# Patient Record
Sex: Male | Born: 1945
Health system: Southern US, Community
[De-identification: ages and names within clinical notes are randomized; demographics above are authoritative.]

## PROBLEM LIST (undated history)

## (undated) DIAGNOSIS — E785 Hyperlipidemia, unspecified: Secondary | ICD-10-CM

## (undated) DIAGNOSIS — I2510 Atherosclerotic heart disease of native coronary artery without angina pectoris: Secondary | ICD-10-CM

## (undated) DIAGNOSIS — I4891 Unspecified atrial fibrillation: Secondary | ICD-10-CM

## (undated) DIAGNOSIS — I10 Essential (primary) hypertension: Secondary | ICD-10-CM

## (undated) DIAGNOSIS — R002 Palpitations: Secondary | ICD-10-CM

## (undated) HISTORY — DX: Palpitations: R00.2

## (undated) HISTORY — DX: Atherosclerotic heart disease of native coronary artery without angina pectoris: I25.10

## (undated) HISTORY — DX: Essential (primary) hypertension: I10

## (undated) HISTORY — DX: Unspecified atrial fibrillation: I48.91

## (undated) HISTORY — DX: Hyperlipidemia, unspecified: E78.5

---

## 1997-07-30 ENCOUNTER — Other Ambulatory Visit: Admission: RE | Admit: 1997-07-30 | Discharge: 1997-07-30 | Payer: Self-pay | Admitting: Family Medicine

## 2000-01-17 HISTORY — PX: APPENDECTOMY: SHX54

## 2000-12-14 ENCOUNTER — Encounter: Admission: RE | Admit: 2000-12-14 | Discharge: 2000-12-14 | Payer: Self-pay | Admitting: Family Medicine

## 2000-12-14 ENCOUNTER — Encounter: Payer: Self-pay | Admitting: Family Medicine

## 2000-12-20 ENCOUNTER — Encounter: Payer: Self-pay | Admitting: Surgery

## 2000-12-20 ENCOUNTER — Encounter: Payer: Self-pay | Admitting: Family Medicine

## 2000-12-20 ENCOUNTER — Encounter: Admission: RE | Admit: 2000-12-20 | Discharge: 2000-12-20 | Payer: Self-pay | Admitting: Family Medicine

## 2000-12-20 ENCOUNTER — Emergency Department (HOSPITAL_COMMUNITY): Admission: EM | Admit: 2000-12-20 | Discharge: 2000-12-20 | Payer: Self-pay | Admitting: Emergency Medicine

## 2001-01-31 ENCOUNTER — Inpatient Hospital Stay (HOSPITAL_COMMUNITY): Admission: RE | Admit: 2001-01-31 | Discharge: 2001-02-01 | Payer: Self-pay | Admitting: Surgery

## 2001-06-25 ENCOUNTER — Ambulatory Visit (HOSPITAL_COMMUNITY): Admission: RE | Admit: 2001-06-25 | Discharge: 2001-06-25 | Payer: Self-pay | Admitting: Gastroenterology

## 2002-03-17 ENCOUNTER — Encounter: Payer: Self-pay | Admitting: Family Medicine

## 2002-03-17 ENCOUNTER — Encounter: Admission: RE | Admit: 2002-03-17 | Discharge: 2002-03-17 | Payer: Self-pay | Admitting: Family Medicine

## 2002-11-17 HISTORY — PX: KNEE CARTILAGE SURGERY: SHX688

## 2003-04-19 DIAGNOSIS — I2510 Atherosclerotic heart disease of native coronary artery without angina pectoris: Secondary | ICD-10-CM

## 2003-04-19 HISTORY — DX: Atherosclerotic heart disease of native coronary artery without angina pectoris: I25.10

## 2003-08-18 ENCOUNTER — Ambulatory Visit (HOSPITAL_COMMUNITY): Admission: RE | Admit: 2003-08-18 | Discharge: 2003-08-18 | Payer: Self-pay | Admitting: *Deleted

## 2003-09-08 ENCOUNTER — Ambulatory Visit (HOSPITAL_COMMUNITY): Admission: RE | Admit: 2003-09-08 | Discharge: 2003-09-09 | Payer: Self-pay | Admitting: *Deleted

## 2004-03-10 ENCOUNTER — Ambulatory Visit (HOSPITAL_COMMUNITY): Admission: RE | Admit: 2004-03-10 | Discharge: 2004-03-10 | Payer: Self-pay | Admitting: *Deleted

## 2004-03-18 HISTORY — PX: CARDIAC CATHETERIZATION: SHX172

## 2004-07-17 HISTORY — PX: COLONOSCOPY: SHX174

## 2004-08-05 ENCOUNTER — Ambulatory Visit (HOSPITAL_COMMUNITY): Admission: RE | Admit: 2004-08-05 | Discharge: 2004-08-05 | Payer: Self-pay | Admitting: Gastroenterology

## 2005-05-27 ENCOUNTER — Ambulatory Visit (HOSPITAL_BASED_OUTPATIENT_CLINIC_OR_DEPARTMENT_OTHER): Admission: RE | Admit: 2005-05-27 | Discharge: 2005-05-27 | Payer: Self-pay | Admitting: Surgery

## 2007-06-19 DIAGNOSIS — I4891 Unspecified atrial fibrillation: Secondary | ICD-10-CM

## 2007-06-19 HISTORY — DX: Unspecified atrial fibrillation: I48.91

## 2011-04-20 DIAGNOSIS — I4891 Unspecified atrial fibrillation: Secondary | ICD-10-CM | POA: Diagnosis not present

## 2011-05-23 DIAGNOSIS — I4891 Unspecified atrial fibrillation: Secondary | ICD-10-CM | POA: Diagnosis not present

## 2011-06-20 DIAGNOSIS — I4891 Unspecified atrial fibrillation: Secondary | ICD-10-CM | POA: Diagnosis not present

## 2011-07-21 DIAGNOSIS — I4891 Unspecified atrial fibrillation: Secondary | ICD-10-CM | POA: Diagnosis not present

## 2011-08-18 DIAGNOSIS — I4891 Unspecified atrial fibrillation: Secondary | ICD-10-CM | POA: Diagnosis not present

## 2011-09-15 DIAGNOSIS — I2510 Atherosclerotic heart disease of native coronary artery without angina pectoris: Secondary | ICD-10-CM | POA: Diagnosis not present

## 2011-09-15 DIAGNOSIS — E782 Mixed hyperlipidemia: Secondary | ICD-10-CM | POA: Diagnosis not present

## 2011-09-15 DIAGNOSIS — I4891 Unspecified atrial fibrillation: Secondary | ICD-10-CM | POA: Diagnosis not present

## 2011-10-17 DIAGNOSIS — I4891 Unspecified atrial fibrillation: Secondary | ICD-10-CM | POA: Diagnosis not present

## 2011-10-17 DIAGNOSIS — Z7901 Long term (current) use of anticoagulants: Secondary | ICD-10-CM | POA: Diagnosis not present

## 2011-11-07 DIAGNOSIS — D239 Other benign neoplasm of skin, unspecified: Secondary | ICD-10-CM | POA: Diagnosis not present

## 2011-11-07 DIAGNOSIS — Z85828 Personal history of other malignant neoplasm of skin: Secondary | ICD-10-CM | POA: Diagnosis not present

## 2011-11-07 DIAGNOSIS — L821 Other seborrheic keratosis: Secondary | ICD-10-CM | POA: Diagnosis not present

## 2011-11-07 DIAGNOSIS — L570 Actinic keratosis: Secondary | ICD-10-CM | POA: Diagnosis not present

## 2011-11-14 DIAGNOSIS — I4891 Unspecified atrial fibrillation: Secondary | ICD-10-CM | POA: Diagnosis not present

## 2011-12-12 DIAGNOSIS — I4891 Unspecified atrial fibrillation: Secondary | ICD-10-CM | POA: Diagnosis not present

## 2012-01-17 DIAGNOSIS — I4891 Unspecified atrial fibrillation: Secondary | ICD-10-CM | POA: Diagnosis not present

## 2012-02-20 DIAGNOSIS — M79609 Pain in unspecified limb: Secondary | ICD-10-CM | POA: Diagnosis not present

## 2012-02-20 DIAGNOSIS — S6990XA Unspecified injury of unspecified wrist, hand and finger(s), initial encounter: Secondary | ICD-10-CM | POA: Diagnosis not present

## 2012-02-20 DIAGNOSIS — M7989 Other specified soft tissue disorders: Secondary | ICD-10-CM | POA: Diagnosis not present

## 2012-02-21 DIAGNOSIS — I4891 Unspecified atrial fibrillation: Secondary | ICD-10-CM | POA: Diagnosis not present

## 2012-02-28 DIAGNOSIS — I4891 Unspecified atrial fibrillation: Secondary | ICD-10-CM | POA: Diagnosis not present

## 2012-02-28 DIAGNOSIS — I2510 Atherosclerotic heart disease of native coronary artery without angina pectoris: Secondary | ICD-10-CM | POA: Diagnosis not present

## 2012-02-28 DIAGNOSIS — E782 Mixed hyperlipidemia: Secondary | ICD-10-CM | POA: Diagnosis not present

## 2012-03-01 DIAGNOSIS — E782 Mixed hyperlipidemia: Secondary | ICD-10-CM | POA: Diagnosis not present

## 2012-03-05 DIAGNOSIS — Z23 Encounter for immunization: Secondary | ICD-10-CM | POA: Diagnosis not present

## 2012-03-29 DIAGNOSIS — I4891 Unspecified atrial fibrillation: Secondary | ICD-10-CM | POA: Diagnosis not present

## 2012-04-26 DIAGNOSIS — I4891 Unspecified atrial fibrillation: Secondary | ICD-10-CM | POA: Diagnosis not present

## 2012-06-22 ENCOUNTER — Ambulatory Visit: Payer: Self-pay | Admitting: Internal Medicine

## 2012-06-22 DIAGNOSIS — Z7901 Long term (current) use of anticoagulants: Secondary | ICD-10-CM | POA: Insufficient documentation

## 2012-06-22 DIAGNOSIS — I4891 Unspecified atrial fibrillation: Secondary | ICD-10-CM | POA: Insufficient documentation

## 2012-07-13 ENCOUNTER — Encounter: Payer: Self-pay | Admitting: *Deleted

## 2012-09-04 ENCOUNTER — Ambulatory Visit (INDEPENDENT_AMBULATORY_CARE_PROVIDER_SITE_OTHER): Payer: Medicare Other | Admitting: Pharmacist Clinician (PhC)/ Clinical Pharmacy Specialist

## 2012-09-04 DIAGNOSIS — I4891 Unspecified atrial fibrillation: Secondary | ICD-10-CM

## 2012-09-04 DIAGNOSIS — Z7901 Long term (current) use of anticoagulants: Secondary | ICD-10-CM | POA: Diagnosis not present

## 2012-09-05 ENCOUNTER — Ambulatory Visit: Payer: Self-pay

## 2012-09-12 ENCOUNTER — Telehealth: Payer: Self-pay | Admitting: Internal Medicine

## 2012-09-20 ENCOUNTER — Ambulatory Visit: Payer: Self-pay | Admitting: Internal Medicine

## 2012-10-08 ENCOUNTER — Encounter: Payer: Self-pay | Admitting: Cardiovascular Disease

## 2012-11-03 ENCOUNTER — Other Ambulatory Visit: Payer: Self-pay | Admitting: Internal Medicine

## 2012-11-05 NOTE — Telephone Encounter (Signed)
Refill on metoprolol w/3 refills

## 2012-11-08 ENCOUNTER — Other Ambulatory Visit: Payer: Self-pay | Admitting: Internal Medicine

## 2012-11-12 NOTE — Telephone Encounter (Signed)
Rx was sent to pharmacy electronically. 

## 2012-11-13 ENCOUNTER — Ambulatory Visit (INDEPENDENT_AMBULATORY_CARE_PROVIDER_SITE_OTHER): Payer: Medicare Other | Admitting: Pharmacist Clinician (PhC)/ Clinical Pharmacy Specialist

## 2012-11-13 DIAGNOSIS — Z7901 Long term (current) use of anticoagulants: Secondary | ICD-10-CM

## 2012-11-13 DIAGNOSIS — I4891 Unspecified atrial fibrillation: Secondary | ICD-10-CM

## 2012-11-13 LAB — POCT INR: INR: 2.5

## 2012-11-13 MED ORDER — WARFARIN SODIUM 5 MG PO TABS: ORAL_TABLET | ORAL | Status: DC

## 2012-11-14 ENCOUNTER — Other Ambulatory Visit: Payer: Self-pay | Admitting: Dermatology

## 2012-11-22 ENCOUNTER — Ambulatory Visit: Payer: Medicare Other | Admitting: Internal Medicine

## 2012-11-30 ENCOUNTER — Ambulatory Visit (INDEPENDENT_AMBULATORY_CARE_PROVIDER_SITE_OTHER): Payer: Medicare Other | Admitting: Internal Medicine

## 2012-11-30 ENCOUNTER — Encounter: Payer: Self-pay | Admitting: Internal Medicine

## 2012-11-30 VITALS — BP 118/82 | HR 59 | Ht 75.0 in | Wt 284.5 lb

## 2012-11-30 DIAGNOSIS — I4891 Unspecified atrial fibrillation: Secondary | ICD-10-CM

## 2012-11-30 DIAGNOSIS — I2510 Atherosclerotic heart disease of native coronary artery without angina pectoris: Secondary | ICD-10-CM

## 2012-11-30 DIAGNOSIS — M6530 Trigger finger, unspecified finger: Secondary | ICD-10-CM

## 2012-11-30 DIAGNOSIS — Z7901 Long term (current) use of anticoagulants: Secondary | ICD-10-CM

## 2012-11-30 DIAGNOSIS — E785 Hyperlipidemia, unspecified: Secondary | ICD-10-CM

## 2012-11-30 NOTE — Patient Instructions (Signed)
You have been referred to Dr. Amanda Pea for your hand.  Your physician wants you to follow-up in: 1 year. You will receive a reminder letter in the mail two months in advance. If you don't receive a letter, please call our office to schedule the follow-up appointment.

## 2012-12-01 ENCOUNTER — Encounter: Payer: Self-pay | Admitting: Internal Medicine

## 2012-12-01 DIAGNOSIS — E785 Hyperlipidemia, unspecified: Secondary | ICD-10-CM | POA: Insufficient documentation

## 2012-12-01 DIAGNOSIS — I2510 Atherosclerotic heart disease of native coronary artery without angina pectoris: Secondary | ICD-10-CM | POA: Insufficient documentation

## 2012-12-01 NOTE — Progress Notes (Signed)
OFFICE NOTE  Chief Complaint:  Routine office visit  Primary Care Physician: Londell Moh, MD  HPI:  Gerald Frey is a 67 year old gentleman who I am following for history of chronic A-fib on Coumadin, hypertension, dyslipidemia. Overall, again doing fairly well without any new concerns today. He denies any shortness of breath, palpitations, presyncope, or syncopal symptoms. INR in the office today was 3.0 which is stable. This is a history of coronary disease and had a stent to the LAD in 2005.  He has no anginal complaints.  PMHx:  Past Medical History  Diagnosis Date  . Atrial fibrillation 06/19/2007    2D Echo EF=45-50%  . Hypertension   . Dyslipidemia   . CAD (coronary artery disease) 2005    Stents.  . Palpitation     Past Surgical History  Procedure Laterality Date  . Cardiac catheterization  03/18/2004    CYPHER 3.5x68mm stent in the proximal LAD  . Colonoscopy  07/2004    hx colon polyps  . Appendectomy  01/2000  . Knee cartilage surgery  11/2002    FAMHx:  Family History  Problem Relation Age of Onset  . Heart failure Father   . Alzheimer's disease Father 46  . Emphysema Mother 67    SOCHx:   reports that he has never smoked. He has never used smokeless tobacco. He reports that he does not drink alcohol or use illicit drugs.  ALLERGIES:  No Known Allergies  ROS: A comprehensive review of systems was negative.  HOME MEDS: Current Outpatient Prescriptions  Medication Sig Dispense Refill  . aspirin 81 MG tablet Take 81 mg by mouth daily.      . cetirizine (ZYRTEC) 10 MG tablet Take 10 mg by mouth daily.      [name] Kitchen diltiazem (CARDIZEM CD) 120 MG 24 hr capsule Take 120 mg by mouth daily.      . fish oil-omega-3 fatty acids 1000 MG capsule Take 1 g by mouth daily.      . metoprolol (LOPRESSOR) 50 MG tablet TAKE 1 TABLET BY MOUTH TWICE A DAY  60 tablet  3  . niacin (NIASPAN) 1000 MG CR tablet Take 1,000 mg by mouth at bedtime.      .  rosuvastatin (CRESTOR) 20 MG tablet Take 20 mg by mouth daily.      [name] Kitchen warfarin (COUMADIN) 5 MG tablet Take as directed by Anticoagulation clinic  120 tablet  1   No current facility-administered medications for this visit.    LABS/IMAGING: No results found for this or any previous visit (from the past 48 hour(s)). No results found.  VITALS: BP 118/82  Pulse 59  Ht 6\' 3"  (1.905 m)  Wt 284 lb 8 oz (129.048 kg)  BMI 35.56 kg/m2  EXAM: General appearance: alert and no distress Neck: no adenopathy, no carotid bruit, no JVD, supple, symmetrical, trachea midline and thyroid not enlarged, symmetric, no tenderness/mass/nodules Lungs: clear to auscultation bilaterally Heart: regular rate and rhythm, S1, S2 normal, no murmur, click, rub or gallop Abdomen: soft, non-tender; bowel sounds normal; no masses,  no organomegaly Extremities: extremities normal, atraumatic, no cyanosis or edema Pulses: 2+ and symmetric Skin: Skin color, texture, turgor normal. No rashes or lesions Neurologic: Grossly normal  EKG: Normal sinus rhythm at 59  ASSESSMENT: 1. Coronary artery disease status post PCI of the LAD 2005 2. Chronic atrial fibrillation on warfarin 3. Dyslipidemia  PLAN: 1.   [name] continues to do very well on warfarin which has been stable  and therapeutic for some time. His atrial fibrillation is also rate controlled. He denies any further chest pain. His last lipid profile in November 2013 and I think it's time to repeat that. Otherwise would not recommend any changes to his medications at this time.  Chrystie Nose, MD, [facility] Attending Cardiologist [facility]  Gopal Malter C 12/01/2012, 2:07 PM

## 2012-12-02 ENCOUNTER — Other Ambulatory Visit: Payer: Self-pay | Admitting: Internal Medicine

## 2012-12-04 ENCOUNTER — Other Ambulatory Visit: Payer: Self-pay | Admitting: Internal Medicine

## 2012-12-04 NOTE — Telephone Encounter (Signed)
Faxed referral to dr. Amanda Pea @ [facility] for trigger finger.[name] KitchenMarland Kitchen

## 2012-12-07 MED ORDER — ROSUVASTATIN CALCIUM 20 MG PO TABS: 20.0000 mg | ORAL_TABLET | Freq: Every day | ORAL | Status: DC

## 2012-12-07 NOTE — Telephone Encounter (Signed)
Rx was sent to pharmacy electronically. 

## 2012-12-25 ENCOUNTER — Ambulatory Visit (INDEPENDENT_AMBULATORY_CARE_PROVIDER_SITE_OTHER): Payer: Medicare Other | Admitting: Pharmacist Clinician (PhC)/ Clinical Pharmacy Specialist

## 2012-12-25 DIAGNOSIS — Z7901 Long term (current) use of anticoagulants: Secondary | ICD-10-CM

## 2012-12-25 DIAGNOSIS — I4891 Unspecified atrial fibrillation: Secondary | ICD-10-CM

## 2012-12-25 LAB — POCT INR: INR: 2.2

## 2013-02-04 ENCOUNTER — Other Ambulatory Visit: Payer: Self-pay | Admitting: Internal Medicine

## 2013-02-04 NOTE — Telephone Encounter (Signed)
Rx was sent to pharmacy electronically. 

## 2013-02-05 ENCOUNTER — Ambulatory Visit (INDEPENDENT_AMBULATORY_CARE_PROVIDER_SITE_OTHER): Payer: Medicare Other | Admitting: Pharmacist Clinician (PhC)/ Clinical Pharmacy Specialist

## 2013-02-05 VITALS — BP 120/70

## 2013-02-05 DIAGNOSIS — Z7901 Long term (current) use of anticoagulants: Secondary | ICD-10-CM

## 2013-02-05 DIAGNOSIS — I4891 Unspecified atrial fibrillation: Secondary | ICD-10-CM

## 2013-02-05 LAB — POCT INR: INR: 3

## 2013-03-09 ENCOUNTER — Other Ambulatory Visit: Payer: Self-pay | Admitting: Internal Medicine

## 2013-03-11 NOTE — Telephone Encounter (Signed)
Rx was sent to pharmacy electronically. 

## 2013-03-19 ENCOUNTER — Ambulatory Visit (INDEPENDENT_AMBULATORY_CARE_PROVIDER_SITE_OTHER): Payer: Medicare Other | Admitting: Pharmacist Clinician (PhC)/ Clinical Pharmacy Specialist

## 2013-03-19 VITALS — BP 108/72 | HR 72

## 2013-03-19 DIAGNOSIS — I4891 Unspecified atrial fibrillation: Secondary | ICD-10-CM

## 2013-03-19 DIAGNOSIS — Z7901 Long term (current) use of anticoagulants: Secondary | ICD-10-CM

## 2013-03-19 LAB — POCT INR: INR: 2.8

## 2013-03-29 ENCOUNTER — Other Ambulatory Visit: Payer: Self-pay | Admitting: Internal Medicine

## 2013-03-29 NOTE — Telephone Encounter (Signed)
Rx was sent to pharmacy electronically. 

## 2013-04-08 ENCOUNTER — Other Ambulatory Visit: Payer: Self-pay | Admitting: Internal Medicine

## 2013-04-30 ENCOUNTER — Ambulatory Visit (INDEPENDENT_AMBULATORY_CARE_PROVIDER_SITE_OTHER): Payer: Medicare Other | Admitting: Pharmacist Clinician (PhC)/ Clinical Pharmacy Specialist

## 2013-04-30 VITALS — BP 120/72 | HR 84

## 2013-04-30 DIAGNOSIS — Z7901 Long term (current) use of anticoagulants: Secondary | ICD-10-CM

## 2013-04-30 DIAGNOSIS — I4891 Unspecified atrial fibrillation: Secondary | ICD-10-CM

## 2013-04-30 LAB — POCT INR: INR: 2.5

## 2013-05-27 ENCOUNTER — Other Ambulatory Visit: Payer: Self-pay | Admitting: Internal Medicine

## 2013-05-27 NOTE — Telephone Encounter (Signed)
Rx was sent to pharmacy electronically. 

## 2013-05-28 ENCOUNTER — Other Ambulatory Visit: Payer: Self-pay | Admitting: Internal Medicine

## 2013-06-11 ENCOUNTER — Ambulatory Visit: Payer: Medicare Other | Admitting: Pharmacist Clinician (PhC)/ Clinical Pharmacy Specialist

## 2013-06-11 ENCOUNTER — Other Ambulatory Visit: Payer: Self-pay | Admitting: Internal Medicine

## 2013-06-12 ENCOUNTER — Other Ambulatory Visit: Payer: Self-pay | Admitting: Internal Medicine

## 2013-06-12 NOTE — Telephone Encounter (Signed)
Rx was sent to pharmacy electronically. 

## 2013-06-20 ENCOUNTER — Ambulatory Visit (INDEPENDENT_AMBULATORY_CARE_PROVIDER_SITE_OTHER): Payer: Medicare Other | Admitting: Pharmacist Clinician (PhC)/ Clinical Pharmacy Specialist

## 2013-06-20 VITALS — BP 92/60 | HR 64

## 2013-06-20 DIAGNOSIS — I4891 Unspecified atrial fibrillation: Secondary | ICD-10-CM

## 2013-06-20 DIAGNOSIS — Z7901 Long term (current) use of anticoagulants: Secondary | ICD-10-CM

## 2013-06-20 LAB — POCT INR: INR: 3

## 2013-08-06 ENCOUNTER — Ambulatory Visit (INDEPENDENT_AMBULATORY_CARE_PROVIDER_SITE_OTHER): Payer: Medicare Other | Admitting: Pharmacist Clinician (PhC)/ Clinical Pharmacy Specialist

## 2013-08-06 DIAGNOSIS — Z7901 Long term (current) use of anticoagulants: Secondary | ICD-10-CM

## 2013-08-06 DIAGNOSIS — I4891 Unspecified atrial fibrillation: Secondary | ICD-10-CM

## 2013-08-06 LAB — POCT INR: INR: 2.2

## 2013-08-07 NOTE — Telephone Encounter (Signed)
Encounter has been closed--TP 08/07/13 

## 2013-09-16 ENCOUNTER — Ambulatory Visit (INDEPENDENT_AMBULATORY_CARE_PROVIDER_SITE_OTHER): Payer: Medicare Other | Admitting: Pharmacist Clinician (PhC)/ Clinical Pharmacy Specialist

## 2013-09-16 DIAGNOSIS — Z7901 Long term (current) use of anticoagulants: Secondary | ICD-10-CM

## 2013-09-16 DIAGNOSIS — I4891 Unspecified atrial fibrillation: Secondary | ICD-10-CM

## 2013-09-16 LAB — POCT INR: INR: 2.6

## 2013-09-28 ENCOUNTER — Telehealth: Payer: Self-pay | Admitting: Pharmacist Clinician (PhC)/ Clinical Pharmacy Specialist

## 2013-09-30 NOTE — Telephone Encounter (Signed)
Closed encounter °

## 2013-10-24 ENCOUNTER — Telehealth: Payer: Self-pay | Admitting: Cardiovascular Disease

## 2013-10-24 NOTE — Telephone Encounter (Signed)
Closed encounter °

## 2013-10-28 ENCOUNTER — Ambulatory Visit (INDEPENDENT_AMBULATORY_CARE_PROVIDER_SITE_OTHER): Payer: Medicare Other | Admitting: Pharmacist

## 2013-10-28 DIAGNOSIS — I4891 Unspecified atrial fibrillation: Secondary | ICD-10-CM

## 2013-10-28 DIAGNOSIS — Z7901 Long term (current) use of anticoagulants: Secondary | ICD-10-CM

## 2013-10-28 LAB — POCT INR: INR: 3

## 2013-11-25 ENCOUNTER — Other Ambulatory Visit: Payer: Self-pay | Admitting: Dermatology

## 2013-12-08 ENCOUNTER — Other Ambulatory Visit: Payer: Self-pay | Admitting: Internal Medicine

## 2013-12-09 ENCOUNTER — Other Ambulatory Visit: Payer: Self-pay | Admitting: Pulmonary Disease

## 2013-12-09 ENCOUNTER — Ambulatory Visit (INDEPENDENT_AMBULATORY_CARE_PROVIDER_SITE_OTHER): Payer: Medicare Other | Admitting: Pharmacist Clinician (PhC)/ Clinical Pharmacy Specialist

## 2013-12-09 DIAGNOSIS — I4891 Unspecified atrial fibrillation: Secondary | ICD-10-CM

## 2013-12-09 DIAGNOSIS — Z7901 Long term (current) use of anticoagulants: Secondary | ICD-10-CM

## 2013-12-09 LAB — POCT INR: INR: 3.7

## 2013-12-09 NOTE — Telephone Encounter (Signed)
Rx refill sent to patient pharmacy   

## 2013-12-19 ENCOUNTER — Ambulatory Visit (INDEPENDENT_AMBULATORY_CARE_PROVIDER_SITE_OTHER): Payer: Medicare Other | Admitting: Internal Medicine

## 2013-12-19 ENCOUNTER — Encounter: Payer: Self-pay | Admitting: Internal Medicine

## 2013-12-19 ENCOUNTER — Ambulatory Visit (INDEPENDENT_AMBULATORY_CARE_PROVIDER_SITE_OTHER): Payer: Medicare Other | Admitting: *Deleted

## 2013-12-19 VITALS — BP 122/76 | HR 74 | Ht 75.0 in | Wt 285.1 lb

## 2013-12-19 DIAGNOSIS — Z7901 Long term (current) use of anticoagulants: Secondary | ICD-10-CM

## 2013-12-19 DIAGNOSIS — I4891 Unspecified atrial fibrillation: Secondary | ICD-10-CM

## 2013-12-19 DIAGNOSIS — I2510 Atherosclerotic heart disease of native coronary artery without angina pectoris: Secondary | ICD-10-CM

## 2013-12-19 DIAGNOSIS — E785 Hyperlipidemia, unspecified: Secondary | ICD-10-CM

## 2013-12-19 DIAGNOSIS — I482 Chronic atrial fibrillation, unspecified: Secondary | ICD-10-CM

## 2013-12-19 LAB — POCT INR: INR: 2.8

## 2013-12-19 NOTE — Patient Instructions (Signed)
Your physician has made no changes in your current medications.   Dr Debara Pickett wants you to follow-up in 1 year. You will receive a reminder letter in the mail one months in advance. If you don't receive a letter, please call our office to schedule the follow-up appointment.

## 2013-12-19 NOTE — Progress Notes (Signed)
OFFICE NOTE  Chief Complaint:  Routine office visit  Primary Care Physician: Horatio Pel, MD  HPI:  Gerald Frey is a 68 year old gentleman who I am following for history of chronic A-fib on Coumadin, hypertension, dyslipidemia. Overall, again doing fairly well without any new concerns today. He denies any shortness of breath, palpitations, presyncope, or syncopal symptoms. INR in the office today was 3.0 which is stable. This is a history of coronary disease and had a stent to the LAD in 2005.  He has no anginal complaints. Recently he has missed some of his medications and he can definitely tell he feels worse, probably because of RVR.  However, he has been chopping wood without difficulty.  PMHx:  Past Medical History  Diagnosis Date  . Atrial fibrillation 06/19/2007    2D Echo EF=45-50%  . Hypertension   . Dyslipidemia   . CAD (coronary artery disease) 2005    Stents.  . Palpitation     Past Surgical History  Procedure Laterality Date  . Cardiac catheterization  03/18/2004    CYPHER 3.5x24mm stent in the proximal LAD  . Colonoscopy  07/2004    hx colon polyps  . Appendectomy  01/2000  . Knee cartilage surgery  11/2002    FAMHx:  Family History  Problem Relation Age of Onset  . Heart failure Father   . Alzheimer's disease Father 15  . Emphysema Mother 10    SOCHx:   reports that he has never smoked. He has never used smokeless tobacco. He reports that he does not drink alcohol or use illicit drugs.  ALLERGIES:  No Known Allergies  ROS: A comprehensive review of systems was negative.  HOME MEDS: Current Outpatient Prescriptions  Medication Sig Dispense Refill  . aspirin 81 MG tablet Take 81 mg by mouth daily.      . cetirizine (ZYRTEC) 10 MG tablet Take 10 mg by mouth daily.      . CRESTOR 20 MG tablet TAKE 1 TABLET EVERY DAY  30 tablet  6  . diltiazem (CARDIZEM CD) 120 MG 24 hr capsule TAKE 1 CAPSULE EVERY DAY  30 capsule  8   . fish oil-omega-3 fatty acids 1000 MG capsule Take 1 g by mouth daily.      . metoprolol (LOPRESSOR) 50 MG tablet TAKE 1 TABLET BY MOUTH TWICE A DAY  60 tablet  5  . niacin (NIASPAN) 1000 MG CR tablet TAKE 1 TABLET AT BEDTIME  30 tablet  10  . warfarin (COUMADIN) 5 MG tablet Take 1 to 1 & 1/2 tablets by mouth daily as directed  45 tablet  4   No current facility-administered medications for this visit.    LABS/IMAGING: No results found for this or any previous visit (from the past 48 hour(s)). No results found.  VITALS: BP 122/76  Pulse 74  Ht 6\' 3"  (1.905 m)  Wt 285 lb 1.6 oz (129.321 kg)  BMI 35.64 kg/m2  EXAM: General appearance: alert and no distress Neck: no adenopathy, no carotid bruit, no JVD, supple, symmetrical, trachea midline and thyroid not enlarged, symmetric, no tenderness/mass/nodules Lungs: clear to auscultation bilaterally Heart: regular rate and rhythm, S1, S2 normal, no murmur, click, rub or gallop Abdomen: soft, non-tender; bowel sounds normal; no masses,  no organomegaly Extremities: extremities normal, atraumatic, no cyanosis or edema Pulses: 2+ and symmetric Skin: Skin color, texture, turgor normal. No rashes or lesions Neurologic: Grossly normal  EKG: Atrial fibrillation at 74  ASSESSMENT: 1. Coronary artery disease  status post PCI of the LAD 2005 2. Chronic atrial fibrillation on warfarin 3. Dyslipidemia  PLAN: 1.   Mr. Eckardt continues to do very well on warfarin which has been stable and therapeutic for some time. His atrial fibrillation is also rate controlled. He denies any further chest pain. His is due for a lipid profile, which we will check today. Plan to see him back annually or sooner as necessary.  Pixie Casino, MD, [facility] Attending Cardiologist [facility] 12/19/2013, 8:29 AM

## 2013-12-25 ENCOUNTER — Other Ambulatory Visit: Payer: Self-pay | Admitting: Internal Medicine

## 2013-12-25 NOTE — Telephone Encounter (Signed)
Rx was sent to pharmacy electronically. 

## 2014-01-02 LAB — LIPID PANEL
Cholesterol: 148 mg/dL (ref 0–200)
HDL: 42 mg/dL (ref >39)
LDL Cholesterol: 70 mg/dL (ref 0–99)
Total CHOL/HDL Ratio: 3.5 Ratio
Triglycerides: 178 mg/dL — ABNORMAL HIGH (ref <150)
VLDL: 36 mg/dL (ref 0–40)

## 2014-01-03 ENCOUNTER — Ambulatory Visit (INDEPENDENT_AMBULATORY_CARE_PROVIDER_SITE_OTHER): Payer: Medicare Other | Admitting: Pharmacist Clinician (PhC)/ Clinical Pharmacy Specialist

## 2014-01-03 DIAGNOSIS — I4891 Unspecified atrial fibrillation: Secondary | ICD-10-CM

## 2014-01-03 DIAGNOSIS — Z7901 Long term (current) use of anticoagulants: Secondary | ICD-10-CM

## 2014-01-03 LAB — POCT INR: INR: 2.2

## 2014-01-03 NOTE — Progress Notes (Signed)
LMTCB for lab results.  

## 2014-01-07 ENCOUNTER — Other Ambulatory Visit: Payer: Self-pay | Admitting: Internal Medicine

## 2014-01-07 NOTE — Telephone Encounter (Signed)
Rx was sent to pharmacy electronically. 

## 2014-01-20 ENCOUNTER — Other Ambulatory Visit: Payer: Self-pay | Admitting: Internal Medicine

## 2014-01-31 ENCOUNTER — Ambulatory Visit (INDEPENDENT_AMBULATORY_CARE_PROVIDER_SITE_OTHER): Payer: Medicare Other | Admitting: Pharmacist Clinician (PhC)/ Clinical Pharmacy Specialist

## 2014-01-31 DIAGNOSIS — Z7901 Long term (current) use of anticoagulants: Secondary | ICD-10-CM

## 2014-01-31 DIAGNOSIS — I4891 Unspecified atrial fibrillation: Secondary | ICD-10-CM

## 2014-01-31 LAB — POCT INR: INR: 2.7

## 2014-02-06 ENCOUNTER — Other Ambulatory Visit: Payer: Self-pay | Admitting: Internal Medicine

## 2014-02-06 NOTE — Telephone Encounter (Signed)
Rx was sent to pharmacy electronically. 

## 2014-03-07 ENCOUNTER — Ambulatory Visit (INDEPENDENT_AMBULATORY_CARE_PROVIDER_SITE_OTHER): Payer: Medicare Other | Admitting: Pharmacist Clinician (PhC)/ Clinical Pharmacy Specialist

## 2014-03-07 DIAGNOSIS — I4891 Unspecified atrial fibrillation: Secondary | ICD-10-CM

## 2014-03-07 DIAGNOSIS — Z7901 Long term (current) use of anticoagulants: Secondary | ICD-10-CM

## 2014-03-07 LAB — POCT INR: INR: 3.2

## 2014-04-04 ENCOUNTER — Ambulatory Visit (INDEPENDENT_AMBULATORY_CARE_PROVIDER_SITE_OTHER): Payer: Medicare Other | Admitting: Pharmacist Clinician (PhC)/ Clinical Pharmacy Specialist

## 2014-04-04 DIAGNOSIS — I4891 Unspecified atrial fibrillation: Secondary | ICD-10-CM

## 2014-04-04 DIAGNOSIS — Z7901 Long term (current) use of anticoagulants: Secondary | ICD-10-CM

## 2014-04-04 LAB — POCT INR: INR: 3

## 2014-05-02 ENCOUNTER — Ambulatory Visit (INDEPENDENT_AMBULATORY_CARE_PROVIDER_SITE_OTHER): Payer: Medicare Other | Admitting: Pharmacist Clinician (PhC)/ Clinical Pharmacy Specialist

## 2014-05-02 DIAGNOSIS — Z7901 Long term (current) use of anticoagulants: Secondary | ICD-10-CM | POA: Diagnosis not present

## 2014-05-02 DIAGNOSIS — I4891 Unspecified atrial fibrillation: Secondary | ICD-10-CM

## 2014-05-02 LAB — POCT INR: INR: 2.5

## 2014-06-06 ENCOUNTER — Ambulatory Visit (INDEPENDENT_AMBULATORY_CARE_PROVIDER_SITE_OTHER): Payer: Medicare Other | Admitting: Pharmacist Clinician (PhC)/ Clinical Pharmacy Specialist

## 2014-06-06 DIAGNOSIS — Z7901 Long term (current) use of anticoagulants: Secondary | ICD-10-CM

## 2014-06-06 DIAGNOSIS — I4891 Unspecified atrial fibrillation: Secondary | ICD-10-CM

## 2014-06-06 LAB — POCT INR: INR: 2.8

## 2014-07-07 ENCOUNTER — Other Ambulatory Visit: Payer: Self-pay | Admitting: Pulmonary Disease

## 2014-07-08 DIAGNOSIS — H2513 Age-related nuclear cataract, bilateral: Secondary | ICD-10-CM | POA: Diagnosis not present

## 2014-07-09 ENCOUNTER — Other Ambulatory Visit: Payer: Self-pay | Admitting: Internal Medicine

## 2014-07-18 ENCOUNTER — Ambulatory Visit (INDEPENDENT_AMBULATORY_CARE_PROVIDER_SITE_OTHER): Payer: Medicare Other | Admitting: Pharmacist Clinician (PhC)/ Clinical Pharmacy Specialist

## 2014-07-18 DIAGNOSIS — Z7901 Long term (current) use of anticoagulants: Secondary | ICD-10-CM | POA: Diagnosis not present

## 2014-07-18 DIAGNOSIS — I4891 Unspecified atrial fibrillation: Secondary | ICD-10-CM | POA: Diagnosis not present

## 2014-07-18 LAB — POCT INR: INR: 2.6

## 2014-08-13 DIAGNOSIS — H2513 Age-related nuclear cataract, bilateral: Secondary | ICD-10-CM | POA: Diagnosis not present

## 2014-08-13 DIAGNOSIS — H524 Presbyopia: Secondary | ICD-10-CM | POA: Diagnosis not present

## 2014-08-28 ENCOUNTER — Ambulatory Visit (INDEPENDENT_AMBULATORY_CARE_PROVIDER_SITE_OTHER): Payer: Medicare Other | Admitting: Pharmacist Clinician (PhC)/ Clinical Pharmacy Specialist

## 2014-08-28 DIAGNOSIS — I4891 Unspecified atrial fibrillation: Secondary | ICD-10-CM

## 2014-08-28 DIAGNOSIS — Z7901 Long term (current) use of anticoagulants: Secondary | ICD-10-CM | POA: Diagnosis not present

## 2014-08-28 LAB — POCT INR: INR: 2.3

## 2014-09-03 ENCOUNTER — Telehealth: Payer: Self-pay | Admitting: Internal Medicine

## 2014-09-03 NOTE — Telephone Encounter (Signed)
Pt said when he picked up his Crestor refill yesterday, it was generic. He says is this all right?

## 2014-09-03 NOTE — Telephone Encounter (Signed)
Pt wanted to make sure generic formulation of rosuvastatin fine to take - Informed him that is OK.

## 2014-09-08 DIAGNOSIS — H2511 Age-related nuclear cataract, right eye: Secondary | ICD-10-CM | POA: Diagnosis not present

## 2014-09-17 DIAGNOSIS — H2511 Age-related nuclear cataract, right eye: Secondary | ICD-10-CM | POA: Diagnosis not present

## 2014-09-17 DIAGNOSIS — H25811 Combined forms of age-related cataract, right eye: Secondary | ICD-10-CM | POA: Diagnosis not present

## 2014-09-25 DIAGNOSIS — H2511 Age-related nuclear cataract, right eye: Secondary | ICD-10-CM | POA: Diagnosis not present

## 2014-09-30 ENCOUNTER — Telehealth: Payer: Self-pay | Admitting: Internal Medicine

## 2014-10-01 NOTE — Telephone Encounter (Signed)
Close encounter 

## 2014-10-06 DIAGNOSIS — H2511 Age-related nuclear cataract, right eye: Secondary | ICD-10-CM | POA: Diagnosis not present

## 2014-10-06 DIAGNOSIS — H25812 Combined forms of age-related cataract, left eye: Secondary | ICD-10-CM | POA: Diagnosis not present

## 2014-10-09 DIAGNOSIS — E780 Pure hypercholesterolemia: Secondary | ICD-10-CM | POA: Diagnosis not present

## 2014-10-09 DIAGNOSIS — Z125 Encounter for screening for malignant neoplasm of prostate: Secondary | ICD-10-CM | POA: Diagnosis not present

## 2014-10-09 DIAGNOSIS — Z0000 Encounter for general adult medical examination without abnormal findings: Secondary | ICD-10-CM | POA: Diagnosis not present

## 2014-10-10 ENCOUNTER — Ambulatory Visit (INDEPENDENT_AMBULATORY_CARE_PROVIDER_SITE_OTHER): Payer: Medicare Other | Admitting: Pharmacist

## 2014-10-10 DIAGNOSIS — I4891 Unspecified atrial fibrillation: Secondary | ICD-10-CM

## 2014-10-10 DIAGNOSIS — Z7901 Long term (current) use of anticoagulants: Secondary | ICD-10-CM | POA: Diagnosis not present

## 2014-10-10 LAB — POCT INR: INR: 2.7

## 2014-10-15 DIAGNOSIS — E780 Pure hypercholesterolemia: Secondary | ICD-10-CM | POA: Diagnosis not present

## 2014-10-15 DIAGNOSIS — N401 Enlarged prostate with lower urinary tract symptoms: Secondary | ICD-10-CM | POA: Diagnosis not present

## 2014-10-15 DIAGNOSIS — I4891 Unspecified atrial fibrillation: Secondary | ICD-10-CM | POA: Diagnosis not present

## 2014-10-15 DIAGNOSIS — I2510 Atherosclerotic heart disease of native coronary artery without angina pectoris: Secondary | ICD-10-CM | POA: Diagnosis not present

## 2014-10-16 ENCOUNTER — Telehealth: Payer: Self-pay | Admitting: Internal Medicine

## 2014-10-16 NOTE — Telephone Encounter (Signed)
Dr Shelia Media have put him on Tamsulosin HCL .4mg .Pt wants to know if this will inter with his other medicine.

## 2014-10-16 NOTE — Telephone Encounter (Signed)
Returned call to patient's wife Dr.Hilty advised ok to take Tamulosin,may lower B/P.

## 2014-10-16 NOTE — Telephone Encounter (Signed)
Returned call to patient's wife.Advised I will send message to Dr.Hilty for advice.

## 2014-10-16 NOTE — Telephone Encounter (Signed)
No problem taking that medication - it may lower blood pressure slightly - FYI.  DR. Lemmie Evens

## 2014-10-21 ENCOUNTER — Telehealth: Payer: Self-pay | Admitting: *Deleted

## 2014-10-21 NOTE — Telephone Encounter (Signed)
Ok to hold warfarin for 5 days prior to colonoscopy and restart after.   Dr. Lemmie Evens

## 2014-10-21 NOTE — Telephone Encounter (Signed)
1. Type of surgery: colonoscopy - screening for colon cancer 2. Date of surgery: November 25, 2014 3. Surgeon: Dr. Oletta Lamas with [facility] 4. Medications that need to be held & how long: warfarin - unspecified time frame  5. Fax and/or Phone: (fax) [phone]   (phone) [phone]  Routed to Dr. Debara Pickett

## 2014-10-21 NOTE — Telephone Encounter (Signed)
Encounter routed via EPIC fax to [facility]

## 2014-11-06 ENCOUNTER — Other Ambulatory Visit: Payer: Self-pay | Admitting: Internal Medicine

## 2014-11-06 NOTE — Telephone Encounter (Signed)
REFILL 

## 2014-11-21 ENCOUNTER — Ambulatory Visit (INDEPENDENT_AMBULATORY_CARE_PROVIDER_SITE_OTHER): Payer: Medicare Other | Admitting: Pharmacist Clinician (PhC)/ Clinical Pharmacy Specialist

## 2014-11-21 DIAGNOSIS — Z7901 Long term (current) use of anticoagulants: Secondary | ICD-10-CM

## 2014-11-21 DIAGNOSIS — I4891 Unspecified atrial fibrillation: Secondary | ICD-10-CM | POA: Diagnosis not present

## 2014-11-21 LAB — POCT INR: INR: 2.5

## 2014-11-24 DIAGNOSIS — E780 Pure hypercholesterolemia: Secondary | ICD-10-CM | POA: Diagnosis not present

## 2014-11-25 ENCOUNTER — Other Ambulatory Visit: Payer: Self-pay | Admitting: Gastroenterology

## 2014-11-25 DIAGNOSIS — D126 Benign neoplasm of colon, unspecified: Secondary | ICD-10-CM | POA: Diagnosis not present

## 2014-11-25 DIAGNOSIS — Z09 Encounter for follow-up examination after completed treatment for conditions other than malignant neoplasm: Secondary | ICD-10-CM | POA: Diagnosis not present

## 2014-11-25 DIAGNOSIS — Z86010 Personal history of colonic polyps: Secondary | ICD-10-CM | POA: Diagnosis not present

## 2014-11-25 DIAGNOSIS — K640 First degree hemorrhoids: Secondary | ICD-10-CM | POA: Diagnosis not present

## 2014-11-25 DIAGNOSIS — D123 Benign neoplasm of transverse colon: Secondary | ICD-10-CM | POA: Diagnosis not present

## 2014-12-01 DIAGNOSIS — E669 Obesity, unspecified: Secondary | ICD-10-CM | POA: Diagnosis not present

## 2014-12-01 DIAGNOSIS — E780 Pure hypercholesterolemia: Secondary | ICD-10-CM | POA: Diagnosis not present

## 2014-12-01 DIAGNOSIS — I2510 Atherosclerotic heart disease of native coronary artery without angina pectoris: Secondary | ICD-10-CM | POA: Diagnosis not present

## 2014-12-01 DIAGNOSIS — N401 Enlarged prostate with lower urinary tract symptoms: Secondary | ICD-10-CM | POA: Diagnosis not present

## 2014-12-08 ENCOUNTER — Ambulatory Visit (INDEPENDENT_AMBULATORY_CARE_PROVIDER_SITE_OTHER): Payer: Medicare Other | Admitting: Pharmacist Clinician (PhC)/ Clinical Pharmacy Specialist

## 2014-12-08 DIAGNOSIS — I4891 Unspecified atrial fibrillation: Secondary | ICD-10-CM | POA: Diagnosis not present

## 2014-12-08 DIAGNOSIS — Z7901 Long term (current) use of anticoagulants: Secondary | ICD-10-CM

## 2014-12-08 LAB — POCT INR: INR: 3.2

## 2014-12-16 ENCOUNTER — Other Ambulatory Visit: Payer: Self-pay | Admitting: Internal Medicine

## 2014-12-24 DIAGNOSIS — D485 Neoplasm of uncertain behavior of skin: Secondary | ICD-10-CM | POA: Diagnosis not present

## 2014-12-24 DIAGNOSIS — L821 Other seborrheic keratosis: Secondary | ICD-10-CM | POA: Diagnosis not present

## 2014-12-24 DIAGNOSIS — C44319 Basal cell carcinoma of skin of other parts of face: Secondary | ICD-10-CM | POA: Diagnosis not present

## 2014-12-24 DIAGNOSIS — Z86018 Personal history of other benign neoplasm: Secondary | ICD-10-CM | POA: Diagnosis not present

## 2014-12-24 DIAGNOSIS — Z85828 Personal history of other malignant neoplasm of skin: Secondary | ICD-10-CM | POA: Diagnosis not present

## 2014-12-24 DIAGNOSIS — D225 Melanocytic nevi of trunk: Secondary | ICD-10-CM | POA: Diagnosis not present

## 2015-01-04 ENCOUNTER — Other Ambulatory Visit: Payer: Self-pay | Admitting: Internal Medicine

## 2015-01-05 NOTE — Telephone Encounter (Signed)
Rx request sent to pharmacy.  

## 2015-01-06 ENCOUNTER — Ambulatory Visit (INDEPENDENT_AMBULATORY_CARE_PROVIDER_SITE_OTHER): Payer: Medicare Other | Admitting: Internal Medicine

## 2015-01-06 ENCOUNTER — Ambulatory Visit (INDEPENDENT_AMBULATORY_CARE_PROVIDER_SITE_OTHER): Payer: Medicare Other | Admitting: *Deleted

## 2015-01-06 ENCOUNTER — Encounter: Payer: Self-pay | Admitting: Internal Medicine

## 2015-01-06 VITALS — BP 118/86 | HR 72 | Ht 75.0 in | Wt 289.1 lb

## 2015-01-06 DIAGNOSIS — I2510 Atherosclerotic heart disease of native coronary artery without angina pectoris: Secondary | ICD-10-CM | POA: Diagnosis not present

## 2015-01-06 DIAGNOSIS — I2583 Coronary atherosclerosis due to lipid rich plaque: Secondary | ICD-10-CM

## 2015-01-06 DIAGNOSIS — Z7901 Long term (current) use of anticoagulants: Secondary | ICD-10-CM

## 2015-01-06 DIAGNOSIS — I4891 Unspecified atrial fibrillation: Secondary | ICD-10-CM | POA: Diagnosis not present

## 2015-01-06 DIAGNOSIS — E785 Hyperlipidemia, unspecified: Secondary | ICD-10-CM

## 2015-01-06 LAB — POCT INR: INR: 2.8

## 2015-01-06 NOTE — Patient Instructions (Signed)
Your physician wants you to follow-up in: 1 year with Dr. Hilty. You will receive a reminder letter in the mail two months in advance. If you don't receive a letter, please call our office to schedule the follow-up appointment.  

## 2015-01-06 NOTE — Progress Notes (Signed)
[name] Kitchen               OFFICE NOTE  Chief Complaint:  Routine office visit, no complaints  Primary Care Physician: Horatio Pel, MD  HPI:  Gerald Frey is a 69 year old gentleman who I am following for history of chronic A-fib on Coumadin, hypertension, dyslipidemia. Overall, again doing fairly well without any new concerns today. He denies any shortness of breath, palpitations, presyncope, or syncopal symptoms. INR in the office today was 3.0 which is stable. This is a history of coronary disease and had a stent to the LAD in 2005.  He has no anginal complaints. Recently he has missed some of his medications and he can definitely tell he feels worse, probably because of RVR.  However, he has been chopping wood without difficulty.  I saw Gerald Frey back in the office today. He is again without complaints. He denies any chest pain or worsening shortness of breath. He remains in chronic atrial fibrillation with an INR of 2.8 in the office. He's been very stable we discussed possibly changing to a direct oral anticoagulant before, but he prefers to stay on warfarin.  PMHx:  Past Medical History  Diagnosis Date  . Atrial fibrillation 06/19/2007    2D Echo EF=45-50%  . Hypertension   . Dyslipidemia   . CAD (coronary artery disease) 2005    Stents.  . Palpitation     Past Surgical History  Procedure Laterality Date  . Cardiac catheterization  03/18/2004    CYPHER 3.5x63mm stent in the proximal LAD  . Colonoscopy  07/2004    hx colon polyps  . Appendectomy  01/2000  . Knee cartilage surgery  11/2002    FAMHx:  Family History  Problem Relation Age of Onset  . Heart failure Father   . Alzheimer's disease Father 49  . Emphysema Mother 63    SOCHx:   reports that he has never smoked. He has never used smokeless tobacco. He reports that he does not drink alcohol or use illicit drugs.  ALLERGIES:  No Known Allergies  ROS: A comprehensive review of systems was  negative.  HOME MEDS: Current Outpatient Prescriptions  Medication Sig Dispense Refill  . aspirin 81 MG tablet Take 81 mg by mouth daily.    . cetirizine (ZYRTEC) 10 MG tablet Take 10 mg by mouth daily.    [name] Kitchen diltiazem (CARDIZEM CD) 120 MG 24 hr capsule TAKE 1 CAPSULE EVERY DAY 30 capsule 11  . fish oil-omega-3 fatty acids 1000 MG capsule Take 1 g by mouth daily.    . metoprolol (LOPRESSOR) 50 MG tablet TAKE 1 TABLET TWICE A DAY 60 tablet 0  . rosuvastatin (CRESTOR) 20 MG tablet TAKE 1 TABLET EVERY DAY 30 tablet 2  . tamsulosin (FLOMAX) 0.4 MG CAPS capsule Take 0.4 mg by mouth daily after supper.    . warfarin (COUMADIN) 5 MG tablet TAKE 1 TO 1& 1/2 TABLETS BY MOUTH DAILY AS DIRECTED 45 tablet 4   No current facility-administered medications for this visit.    LABS/IMAGING: No results found for this or any previous visit (from the past 48 hour(s)). No results found.  VITALS: BP 118/86 mmHg  Pulse 72  Ht 6\' 3"  (1.905 m)  Wt 289 lb 1.6 oz (131.135 kg)  BMI 36.14 kg/m2  EXAM: General appearance: alert and no distress Neck: no adenopathy, no carotid bruit, no JVD, supple, symmetrical, trachea midline and thyroid not enlarged, symmetric, no tenderness/mass/nodules Lungs: clear to auscultation bilaterally Heart:  regular rate and rhythm, S1, S2 normal, no murmur, click, rub or gallop Abdomen: soft, non-tender; bowel sounds normal; no masses,  no organomegaly Extremities: extremities normal, atraumatic, no cyanosis or edema Pulses: 2+ and symmetric Skin: Skin color, texture, turgor normal. No rashes or lesions Neurologic: Grossly normal  EKG: Atrial fibrillation at 71  ASSESSMENT: 1. Coronary artery disease status post PCI of the LAD 2005 2. Chronic atrial fibrillation on warfarin - CHADSVASC 2 (HTN, CHF) 3. History of ischemic cardiomyopathy, EF 45-50% 4. Hypertension 5. Dyslipidemia  PLAN: 1.   Gerald Frey continues to do very well on warfarin which has been stable and  therapeutic for some time. His atrial fibrillation is also rate controlled. He denies any further chest pain. Lipids are followed by his PCP. INR is therapeutic today, no changes necessary. Plan to see him back annually or sooner as needed.  Pixie Casino, MD, [facility] Attending Cardiologist [place] 01/06/2015, 1:45 PM

## 2015-01-26 DIAGNOSIS — C44319 Basal cell carcinoma of skin of other parts of face: Secondary | ICD-10-CM | POA: Diagnosis not present

## 2015-02-06 ENCOUNTER — Other Ambulatory Visit: Payer: Self-pay | Admitting: Internal Medicine

## 2015-02-10 ENCOUNTER — Other Ambulatory Visit: Payer: Self-pay | Admitting: Internal Medicine

## 2015-02-10 NOTE — Telephone Encounter (Signed)
E sent to pharmacy 

## 2015-02-18 ENCOUNTER — Ambulatory Visit (INDEPENDENT_AMBULATORY_CARE_PROVIDER_SITE_OTHER): Payer: Medicare Other | Admitting: Pharmacist Clinician (PhC)/ Clinical Pharmacy Specialist

## 2015-02-18 DIAGNOSIS — I4891 Unspecified atrial fibrillation: Secondary | ICD-10-CM | POA: Diagnosis not present

## 2015-02-18 DIAGNOSIS — Z23 Encounter for immunization: Secondary | ICD-10-CM | POA: Diagnosis not present

## 2015-02-18 DIAGNOSIS — Z7901 Long term (current) use of anticoagulants: Secondary | ICD-10-CM

## 2015-02-18 LAB — POCT INR: INR: 2.6

## 2015-03-11 ENCOUNTER — Other Ambulatory Visit: Payer: Self-pay | Admitting: Internal Medicine

## 2015-03-11 ENCOUNTER — Telehealth: Payer: Self-pay | Admitting: Internal Medicine

## 2015-03-11 NOTE — Telephone Encounter (Signed)
Did not need to start note °

## 2015-04-01 ENCOUNTER — Ambulatory Visit (INDEPENDENT_AMBULATORY_CARE_PROVIDER_SITE_OTHER): Payer: Medicare Other | Admitting: Pharmacist Clinician (PhC)/ Clinical Pharmacy Specialist

## 2015-04-01 DIAGNOSIS — Z7901 Long term (current) use of anticoagulants: Secondary | ICD-10-CM | POA: Diagnosis not present

## 2015-04-01 DIAGNOSIS — I4891 Unspecified atrial fibrillation: Secondary | ICD-10-CM

## 2015-04-01 LAB — POCT INR: INR: 2.6

## 2015-04-02 DIAGNOSIS — Z6838 Body mass index (BMI) 38.0-38.9, adult: Secondary | ICD-10-CM | POA: Diagnosis not present

## 2015-04-02 DIAGNOSIS — N401 Enlarged prostate with lower urinary tract symptoms: Secondary | ICD-10-CM | POA: Diagnosis not present

## 2015-04-24 DIAGNOSIS — J069 Acute upper respiratory infection, unspecified: Secondary | ICD-10-CM | POA: Diagnosis not present

## 2015-05-13 ENCOUNTER — Ambulatory Visit (INDEPENDENT_AMBULATORY_CARE_PROVIDER_SITE_OTHER): Payer: Medicare Other | Admitting: Pharmacist Clinician (PhC)/ Clinical Pharmacy Specialist

## 2015-05-13 DIAGNOSIS — Z7901 Long term (current) use of anticoagulants: Secondary | ICD-10-CM | POA: Diagnosis not present

## 2015-05-13 DIAGNOSIS — I4891 Unspecified atrial fibrillation: Secondary | ICD-10-CM | POA: Diagnosis not present

## 2015-05-13 LAB — POCT INR: INR: 3.1

## 2015-05-21 DIAGNOSIS — Z961 Presence of intraocular lens: Secondary | ICD-10-CM | POA: Diagnosis not present

## 2015-05-21 DIAGNOSIS — H26493 Other secondary cataract, bilateral: Secondary | ICD-10-CM | POA: Diagnosis not present

## 2015-06-23 ENCOUNTER — Ambulatory Visit (INDEPENDENT_AMBULATORY_CARE_PROVIDER_SITE_OTHER): Payer: Medicare Other | Admitting: Pharmacist Clinician (PhC)/ Clinical Pharmacy Specialist

## 2015-06-23 DIAGNOSIS — Z7901 Long term (current) use of anticoagulants: Secondary | ICD-10-CM | POA: Diagnosis not present

## 2015-06-23 DIAGNOSIS — I4891 Unspecified atrial fibrillation: Secondary | ICD-10-CM

## 2015-06-23 LAB — POCT INR: INR: 2.5

## 2015-08-07 ENCOUNTER — Ambulatory Visit (INDEPENDENT_AMBULATORY_CARE_PROVIDER_SITE_OTHER): Payer: Medicare Other | Admitting: Pharmacist Clinician (PhC)/ Clinical Pharmacy Specialist

## 2015-08-07 DIAGNOSIS — I4891 Unspecified atrial fibrillation: Secondary | ICD-10-CM | POA: Diagnosis not present

## 2015-08-07 DIAGNOSIS — Z7901 Long term (current) use of anticoagulants: Secondary | ICD-10-CM

## 2015-08-07 LAB — POCT INR: INR: 2.8

## 2015-08-10 DIAGNOSIS — M545 Low back pain: Secondary | ICD-10-CM | POA: Diagnosis not present

## 2015-08-10 DIAGNOSIS — E6609 Other obesity due to excess calories: Secondary | ICD-10-CM | POA: Diagnosis not present

## 2015-09-01 ENCOUNTER — Other Ambulatory Visit: Payer: Self-pay | Admitting: Internal Medicine

## 2015-09-15 DIAGNOSIS — M545 Low back pain: Secondary | ICD-10-CM | POA: Diagnosis not present

## 2015-09-18 ENCOUNTER — Ambulatory Visit (INDEPENDENT_AMBULATORY_CARE_PROVIDER_SITE_OTHER): Payer: Medicare Other | Admitting: Pharmacist

## 2015-09-18 DIAGNOSIS — I4891 Unspecified atrial fibrillation: Secondary | ICD-10-CM | POA: Diagnosis not present

## 2015-09-18 DIAGNOSIS — Z7901 Long term (current) use of anticoagulants: Secondary | ICD-10-CM

## 2015-09-18 LAB — POCT INR: INR: 2.8

## 2015-10-30 ENCOUNTER — Ambulatory Visit (INDEPENDENT_AMBULATORY_CARE_PROVIDER_SITE_OTHER): Payer: Medicare Other | Admitting: Pharmacist Clinician (PhC)/ Clinical Pharmacy Specialist

## 2015-10-30 DIAGNOSIS — I4891 Unspecified atrial fibrillation: Secondary | ICD-10-CM

## 2015-10-30 DIAGNOSIS — Z7901 Long term (current) use of anticoagulants: Secondary | ICD-10-CM | POA: Diagnosis not present

## 2015-10-30 LAB — POCT INR: INR: 2.7

## 2015-11-04 ENCOUNTER — Other Ambulatory Visit: Payer: Self-pay

## 2015-11-04 MED ORDER — WARFARIN SODIUM 5 MG PO TABS: ORAL_TABLET | ORAL | Status: DC

## 2015-11-04 NOTE — Telephone Encounter (Signed)
Refill

## 2015-11-05 ENCOUNTER — Other Ambulatory Visit: Payer: Self-pay | Admitting: Pharmacist

## 2015-11-05 MED ORDER — WARFARIN SODIUM 5 MG PO TABS: ORAL_TABLET | ORAL | Status: DC

## 2015-12-11 ENCOUNTER — Ambulatory Visit (INDEPENDENT_AMBULATORY_CARE_PROVIDER_SITE_OTHER): Payer: Medicare Other | Admitting: Pharmacist Clinician (PhC)/ Clinical Pharmacy Specialist

## 2015-12-11 DIAGNOSIS — Z7901 Long term (current) use of anticoagulants: Secondary | ICD-10-CM

## 2015-12-11 DIAGNOSIS — I4891 Unspecified atrial fibrillation: Secondary | ICD-10-CM

## 2015-12-11 LAB — POCT INR: INR: 2.9

## 2015-12-26 ENCOUNTER — Other Ambulatory Visit: Payer: Self-pay | Admitting: Internal Medicine

## 2015-12-28 NOTE — Telephone Encounter (Signed)
Rx request sent to pharmacy.  

## 2016-01-06 DIAGNOSIS — L814 Other melanin hyperpigmentation: Secondary | ICD-10-CM | POA: Diagnosis not present

## 2016-01-06 DIAGNOSIS — Z86018 Personal history of other benign neoplasm: Secondary | ICD-10-CM | POA: Diagnosis not present

## 2016-01-06 DIAGNOSIS — D2222 Melanocytic nevi of left ear and external auricular canal: Secondary | ICD-10-CM | POA: Diagnosis not present

## 2016-01-06 DIAGNOSIS — Z85828 Personal history of other malignant neoplasm of skin: Secondary | ICD-10-CM | POA: Diagnosis not present

## 2016-01-06 DIAGNOSIS — L821 Other seborrheic keratosis: Secondary | ICD-10-CM | POA: Diagnosis not present

## 2016-01-06 DIAGNOSIS — L570 Actinic keratosis: Secondary | ICD-10-CM | POA: Diagnosis not present

## 2016-01-06 DIAGNOSIS — D225 Melanocytic nevi of trunk: Secondary | ICD-10-CM | POA: Diagnosis not present

## 2016-01-06 DIAGNOSIS — D1801 Hemangioma of skin and subcutaneous tissue: Secondary | ICD-10-CM | POA: Diagnosis not present

## 2016-01-06 DIAGNOSIS — Z23 Encounter for immunization: Secondary | ICD-10-CM | POA: Diagnosis not present

## 2016-01-19 ENCOUNTER — Other Ambulatory Visit: Payer: Self-pay | Admitting: *Deleted

## 2016-01-19 MED ORDER — DILTIAZEM HCL ER COATED BEADS 120 MG PO CP24: 120.0000 mg | ORAL_CAPSULE | Freq: Every day | ORAL | 6 refills | Status: DC

## 2016-01-19 NOTE — Telephone Encounter (Signed)
Rx request sent to pharmacy.  

## 2016-01-22 ENCOUNTER — Ambulatory Visit (INDEPENDENT_AMBULATORY_CARE_PROVIDER_SITE_OTHER): Payer: Medicare Other | Admitting: Pharmacist

## 2016-01-22 DIAGNOSIS — Z7901 Long term (current) use of anticoagulants: Secondary | ICD-10-CM

## 2016-01-22 DIAGNOSIS — I4891 Unspecified atrial fibrillation: Secondary | ICD-10-CM | POA: Diagnosis not present

## 2016-01-22 LAB — POCT INR: INR: 2.8

## 2016-01-30 ENCOUNTER — Other Ambulatory Visit: Payer: Self-pay | Admitting: Internal Medicine

## 2016-02-13 ENCOUNTER — Other Ambulatory Visit: Payer: Self-pay | Admitting: Internal Medicine

## 2016-02-18 ENCOUNTER — Other Ambulatory Visit: Payer: Self-pay | Admitting: Internal Medicine

## 2016-02-22 ENCOUNTER — Telehealth: Payer: Self-pay | Admitting: Internal Medicine

## 2016-02-22 NOTE — Telephone Encounter (Signed)
Called and left a voicemail to call me back and schedule his followup.  Left my name and number.

## 2016-03-04 ENCOUNTER — Ambulatory Visit (INDEPENDENT_AMBULATORY_CARE_PROVIDER_SITE_OTHER): Payer: Medicare Other | Admitting: Pharmacist

## 2016-03-04 DIAGNOSIS — Z7901 Long term (current) use of anticoagulants: Secondary | ICD-10-CM | POA: Diagnosis not present

## 2016-03-04 DIAGNOSIS — I4891 Unspecified atrial fibrillation: Secondary | ICD-10-CM

## 2016-03-04 LAB — POCT INR: INR: 2.5

## 2016-03-22 ENCOUNTER — Ambulatory Visit: Payer: Medicare Other | Admitting: Internal Medicine

## 2016-04-01 ENCOUNTER — Ambulatory Visit (INDEPENDENT_AMBULATORY_CARE_PROVIDER_SITE_OTHER): Payer: Medicare Other | Admitting: Internal Medicine

## 2016-04-01 ENCOUNTER — Encounter: Payer: Self-pay | Admitting: Internal Medicine

## 2016-04-01 ENCOUNTER — Ambulatory Visit (INDEPENDENT_AMBULATORY_CARE_PROVIDER_SITE_OTHER): Payer: Medicare Other | Admitting: Pharmacist

## 2016-04-01 VITALS — BP 116/76 | HR 63 | Ht 75.0 in | Wt 285.4 lb

## 2016-04-01 DIAGNOSIS — I2510 Atherosclerotic heart disease of native coronary artery without angina pectoris: Secondary | ICD-10-CM

## 2016-04-01 DIAGNOSIS — I4891 Unspecified atrial fibrillation: Secondary | ICD-10-CM

## 2016-04-01 DIAGNOSIS — Z7901 Long term (current) use of anticoagulants: Secondary | ICD-10-CM | POA: Diagnosis not present

## 2016-04-01 DIAGNOSIS — E785 Hyperlipidemia, unspecified: Secondary | ICD-10-CM | POA: Diagnosis not present

## 2016-04-01 DIAGNOSIS — I2583 Coronary atherosclerosis due to lipid rich plaque: Secondary | ICD-10-CM

## 2016-04-01 DIAGNOSIS — I255 Ischemic cardiomyopathy: Secondary | ICD-10-CM | POA: Insufficient documentation

## 2016-04-01 LAB — POCT INR: INR: 2.9

## 2016-04-01 NOTE — Progress Notes (Signed)
[name] Kitchen               OFFICE NOTE  Chief Complaint:  Routine office visit, no complaints  Primary Care Physician: Horatio Pel, MD  HPI:  Gerald Frey is a 70 year old gentleman who I am following for history of chronic A-fib on Coumadin, hypertension, dyslipidemia. Overall, again doing fairly well without any new concerns today. He denies any shortness of breath, palpitations, presyncope, or syncopal symptoms. INR in the office today was 3.0 which is stable. This is a history of coronary disease and had a stent to the LAD in 2005.  He has no anginal complaints. Recently he has missed some of his medications and he can definitely tell he feels worse, probably because of RVR.  However, he has been chopping wood without difficulty.  I saw Mr. Galati back in the office today. He is again without complaints. He denies any chest pain or worsening shortness of breath. He remains in chronic atrial fibrillation with an INR of 2.8 in the office. He's been very stable we discussed possibly changing to a direct oral anticoagulant before, but he prefers to stay on warfarin.  04/01/2016  Mr. Feige returns today for follow-up. He reports feeling well, although has gained weight over the last year. He denies any chest pain or worsening shortness of breath. His INR checked today was 2.9. It's been very stable. We again discussed possibly switching to a novel oral anticoagulant, however he prefers to stay on warfarin. Since he's been very well controlled, it's difficult to argue that there would be additional benefit from a NOAC. He's not had any significant bleeding complications. His last LV assessment was in 2009 when EF was 45-50% at that time. He is overdue for cholesterol testing as well.  PMHx:  Past Medical History:  Diagnosis Date  . Atrial fibrillation ([place]) 06/19/2007   2D Echo EF=45-50%  . CAD (coronary artery disease) 2005   Stents.  . Dyslipidemia   . Hypertension   .  Palpitation     Past Surgical History:  Procedure Laterality Date  . APPENDECTOMY  01/2000  . CARDIAC CATHETERIZATION  03/18/2004   CYPHER 3.5x80mm stent in the proximal LAD  . COLONOSCOPY  07/2004   hx colon polyps  . KNEE CARTILAGE SURGERY  11/2002    FAMHx:  Family History  Problem Relation Age of Onset  . Heart failure Father   . Alzheimer's disease Father 10  . Emphysema Mother 57    SOCHx:   reports that he has never smoked. He has never used smokeless tobacco. He reports that he does not drink alcohol or use drugs.  ALLERGIES:  No Known Allergies  ROS: A comprehensive review of systems was negative.  HOME MEDS: Current Outpatient Prescriptions  Medication Sig Dispense Refill  . aspirin 81 MG tablet Take 81 mg by mouth daily.    . cetirizine (ZYRTEC) 10 MG tablet Take 10 mg by mouth daily.    [name] Kitchen diltiazem (CARDIZEM CD) 120 MG 24 hr capsule Take 1 capsule (120 mg total) by mouth daily. 30 capsule 6  . diltiazem (CARDIZEM CD) 120 MG 24 hr capsule TAKE 1 CAPSULE EVERY DAY 30 capsule 0  . fish oil-omega-3 fatty acids 1000 MG capsule Take 1 g by mouth daily.    . metoprolol (LOPRESSOR) 50 MG tablet TAKE 1 TABLET BY MOUTH TWICE A DAY 60 tablet 6  . rosuvastatin (CRESTOR) 20 MG tablet TAKE 1 TABLET EVERY DAY 30 tablet 5  .  tamsulosin (FLOMAX) 0.4 MG CAPS capsule Take 0.4 mg by mouth daily after supper.    . warfarin (COUMADIN) 5 MG tablet TAKE 1 TO 1& 1/2 TABLETS BY MOUTH DAILY AS DIRECTED 135 tablet 0   No current facility-administered medications for this visit.     LABS/IMAGING: Results for orders placed or performed in visit on 04/01/16 (from the past 48 hour(s))  POCT INR     Status: None   Collection Time: 04/01/16  9:45 AM  Result Value Ref Range   INR 2.9    No results found.  VITALS: BP 116/76   Pulse 63   Ht 6\' 3"  (1.905 m)   Wt 285 lb 6.4 oz (129.5 kg)   BMI 35.67 kg/m   EXAM: General appearance: alert and no distress Neck: no adenopathy, no  carotid bruit, no JVD, supple, symmetrical, trachea midline and thyroid not enlarged, symmetric, no tenderness/mass/nodules Lungs: clear to auscultation bilaterally Heart: regular rate and rhythm, S1, S2 normal, no murmur, click, rub or gallop Abdomen: soft, non-tender; bowel sounds normal; no masses,  no organomegaly Extremities: extremities normal, atraumatic, no cyanosis or edema Pulses: 2+ and symmetric Skin: Skin color, texture, turgor normal. No rashes or lesions Neurologic: Grossly normal  EKG: Atrial fibrillation at 62  ASSESSMENT: 1. Coronary artery disease status post PCI of the LAD 2005 2. Chronic atrial fibrillation on warfarin - CHADSVASC 2 (HTN, CHF) 3. History of ischemic cardiomyopathy, EF 45-50% (2009) 4. Hypertension 5. Dyslipidemia  PLAN: 1.   Mr. Closs continues to do very well on warfarin which has been stable and therapeutic for some time. His atrial fibrillation is also rate controlled. He denies any further chest pain. He has not had a recent lipid assessment in our system since 2015. I like to recheck that especially given his recent weight gain. We talked about working on weight loss and dietary changes. He says this is going to be his New Year's resolution. He's overdue for a check of his ischemic cardiomyopathy which showed an EF of 45-50% in 2009. We'll repeat an echocardiogram. He is on aspirin and beta blocker. Follow-up with me annually or sooner as necessary.  Pixie Casino, MD, [facility] Attending Cardiologist [place] [facility] Hilty 04/01/2016, 10:02 AM

## 2016-04-01 NOTE — Patient Instructions (Addendum)
Your physician recommends that you return for lab work fasting (lipid, CMET)  Your physician has requested that you have an echocardiogram @ [address]. Echocardiography is a painless test that uses sound waves to create images of your heart. It provides your doctor with information about the size and shape of your heart and how well your heart's chambers and valves are working. This procedure takes approximately one hour. There are no restrictions for this procedure.   Your physician wants you to follow-up in: ONE YEAR with Dr. Debara Pickett. You will receive a reminder letter in the mail two months in advance. If you don't receive a letter, please call our office to schedule the follow-up appointment.

## 2016-04-05 DIAGNOSIS — E785 Hyperlipidemia, unspecified: Secondary | ICD-10-CM | POA: Diagnosis not present

## 2016-04-05 LAB — LIPID PANEL
CHOL/HDL RATIO: 4.1 ratio (ref <5.0)
CHOLESTEROL: 127 mg/dL (ref <200)
HDL: 31 mg/dL — AB (ref >40)
LDL Cholesterol: 66 mg/dL (ref <100)
TRIGLYCERIDES: 148 mg/dL (ref <150)
VLDL: 30 mg/dL (ref <30)

## 2016-04-05 LAB — COMPREHENSIVE METABOLIC PANEL
ALK PHOS: 32 U/L — AB (ref 40–115)
ALT: 14 U/L (ref 9–46)
AST: 17 U/L (ref 10–35)
Albumin: 4 g/dL (ref 3.6–5.1)
BUN: 16 mg/dL (ref 7–25)
CALCIUM: 8.7 mg/dL (ref 8.6–10.3)
CHLORIDE: 107 mmol/L (ref 98–110)
CO2: 30 mmol/L (ref 20–31)
Creat: 1.24 mg/dL — ABNORMAL HIGH (ref 0.70–1.18)
Glucose, Bld: 108 mg/dL — ABNORMAL HIGH (ref 65–99)
POTASSIUM: 4.8 mmol/L (ref 3.5–5.3)
Sodium: 143 mmol/L (ref 135–146)
TOTAL PROTEIN: 6.4 g/dL (ref 6.1–8.1)
Total Bilirubin: 0.9 mg/dL (ref 0.2–1.2)

## 2016-04-25 ENCOUNTER — Other Ambulatory Visit (HOSPITAL_COMMUNITY): Payer: Medicare Other

## 2016-04-26 ENCOUNTER — Ambulatory Visit (INDEPENDENT_AMBULATORY_CARE_PROVIDER_SITE_OTHER): Payer: Medicare Other | Admitting: Podiatry

## 2016-04-26 ENCOUNTER — Encounter: Payer: Self-pay | Admitting: Podiatry

## 2016-04-26 ENCOUNTER — Ambulatory Visit (INDEPENDENT_AMBULATORY_CARE_PROVIDER_SITE_OTHER): Payer: Medicare Other

## 2016-04-26 VITALS — BP 132/89 | HR 83 | Resp 16 | Ht 75.0 in | Wt 285.0 lb

## 2016-04-26 DIAGNOSIS — M79674 Pain in right toe(s): Secondary | ICD-10-CM | POA: Diagnosis not present

## 2016-04-26 DIAGNOSIS — G5791 Unspecified mononeuropathy of right lower limb: Secondary | ICD-10-CM

## 2016-04-26 NOTE — Progress Notes (Signed)
   Subjective:    Patient ID: Gerald Frey, male    DOB: 1945-10-22, 71 y.o.   MRN: [id number]  HPI: He presents today with chief complaint of numbness with stinging sensations in the plantar aspect of the forefoot right he states it bothers me mostly when I'm relaxing this has developed over the past 2-3 years. He denies a history of peripheral vascular disease he denies a history of diabetes. He denies any back problems or trauma.  Review of Systems  HENT: Positive for hearing loss and tinnitus.   Cardiovascular: Positive for palpitations.  Genitourinary: Positive for frequency.  Neurological: Positive for numbness.  All other systems reviewed and are negative.      Objective:   Physical Exam: Vital signs are stable alert and oriented 3. Pulses are palpable. Neurologic sensorium is diminished to the right forefoot with sharp and dull sensation loss as well as superficial sensory. per Semmes-Weinstein monofilament. This appears to be in a plantar medial tibial branch. Deep tendon reflexes are intact muscle strength is normal. Radiographs taken today do not demonstrate any type of osseus maladies other then pes planus he does have what appears to be a plantar fibroma on palpation beneath the base of the first metatarsal or that may be the first metatarsal base itself that is palpable.        Assessment & Plan:    Entrapment or neuritis possible associated back issue.  Plan: Send for neurology workup.

## 2016-04-27 ENCOUNTER — Telehealth: Payer: Self-pay | Admitting: *Deleted

## 2016-04-27 NOTE — Telephone Encounter (Signed)
Faxed referral, LOV 04/26/2016, and demographics to [facility].

## 2016-05-13 ENCOUNTER — Ambulatory Visit (INDEPENDENT_AMBULATORY_CARE_PROVIDER_SITE_OTHER): Payer: Medicare Other | Admitting: Pharmacist Clinician (PhC)/ Clinical Pharmacy Specialist

## 2016-05-13 DIAGNOSIS — I4891 Unspecified atrial fibrillation: Secondary | ICD-10-CM

## 2016-05-13 DIAGNOSIS — Z7901 Long term (current) use of anticoagulants: Secondary | ICD-10-CM

## 2016-05-13 LAB — POCT INR: INR: 3.3

## 2016-05-15 ENCOUNTER — Other Ambulatory Visit: Payer: Self-pay | Admitting: Internal Medicine

## 2016-05-18 ENCOUNTER — Other Ambulatory Visit: Payer: Self-pay

## 2016-05-18 ENCOUNTER — Ambulatory Visit (HOSPITAL_COMMUNITY): Payer: Medicare Other | Attending: Cardiovascular Disease

## 2016-05-18 DIAGNOSIS — I501 Left ventricular failure: Secondary | ICD-10-CM | POA: Insufficient documentation

## 2016-05-18 DIAGNOSIS — I255 Ischemic cardiomyopathy: Secondary | ICD-10-CM | POA: Insufficient documentation

## 2016-06-03 ENCOUNTER — Ambulatory Visit (INDEPENDENT_AMBULATORY_CARE_PROVIDER_SITE_OTHER): Payer: Medicare Other | Admitting: Pharmacist Clinician (PhC)/ Clinical Pharmacy Specialist

## 2016-06-03 DIAGNOSIS — I4891 Unspecified atrial fibrillation: Secondary | ICD-10-CM | POA: Diagnosis not present

## 2016-06-03 DIAGNOSIS — Z7901 Long term (current) use of anticoagulants: Secondary | ICD-10-CM

## 2016-06-03 LAB — POCT INR: INR: 2.6

## 2016-06-29 ENCOUNTER — Ambulatory Visit (INDEPENDENT_AMBULATORY_CARE_PROVIDER_SITE_OTHER): Payer: Medicare Other | Admitting: Neurology

## 2016-06-29 ENCOUNTER — Other Ambulatory Visit (INDEPENDENT_AMBULATORY_CARE_PROVIDER_SITE_OTHER): Payer: Medicare Other

## 2016-06-29 ENCOUNTER — Encounter: Payer: Self-pay | Admitting: Neurology

## 2016-06-29 VITALS — BP 118/78 | HR 87 | Ht 75.0 in | Wt 283.4 lb

## 2016-06-29 DIAGNOSIS — G5761 Lesion of plantar nerve, right lower limb: Secondary | ICD-10-CM

## 2016-06-29 DIAGNOSIS — R202 Paresthesia of skin: Secondary | ICD-10-CM

## 2016-06-29 LAB — TSH: TSH: 2.81 u[IU]/mL (ref 0.35–4.50)

## 2016-06-29 LAB — VITAMIN B12: Vitamin B-12: 301 pg/mL (ref 211–911)

## 2016-06-29 NOTE — Patient Instructions (Signed)
1.  Check blood work 2.  If symptoms progress and involve lateral sole, please come back and see me and we can reinvestigate your symptoms

## 2016-06-29 NOTE — Progress Notes (Signed)
[facility] Clinic Note - Initial Visit   Date: 06/29/16  Gerald Frey MRN: [id number] DOB: 05-19-1945   Dear Dr. Shelia Media:  Thank you for your kind referral of Gerald Frey for consultation of right foot numbness. Although his history is well known to you, please allow [place] to reiterate it for the purpose of our medical record. The patient was accompanied to the clinic by self.    History of Present Illness: Gerald Frey is a 71 y.o. right-handed Caucasian male with atrial fibrillation and hyperlipidemia presenting for evaluation of right foot numbness.    Starting around ~2014, he first noticed numbness over the pad of the right great toe.  He denies any tingling or burning pain.  Over the years, he started experiencing numbness over the 2nd and 3rd toes, again only involving the pads of the toes and extending into the medial sole.  Symptoms are constant and he has not noticed any alleviating or exacerbating factors.  He denies any weakness or imbalance.  He denies any similar symptoms in the left foot.  He has some mild low back pain over the right side.  Walking or activity improves his achy low back pain.  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     Past Medical History:  Diagnosis Date  . Atrial fibrillation ([place]) 06/19/2007   2D Echo EF=45-50%  . CAD (coronary artery disease) 2005   Stents.  . Dyslipidemia   . Hypertension   . Palpitation     Past Surgical History:  Procedure Laterality Date  . APPENDECTOMY  01/2000  . CARDIAC CATHETERIZATION  03/18/2004   CYPHER 3.5x52mm stent in the proximal LAD  . COLONOSCOPY  07/2004   hx colon polyps  . KNEE CARTILAGE SURGERY  11/2002     Medications:  Outpatient Encounter Prescriptions as of 06/29/2016  Medication Sig  . aspirin 81 MG tablet Take 81 mg by mouth daily.  . cetirizine (ZYRTEC) 10 MG tablet Take 10 mg by  mouth daily.  [name] Kitchen diltiazem (CARDIZEM CD) 120 MG 24 hr capsule Take 1 capsule (120 mg total) by mouth daily.  . fish oil-omega-3 fatty acids 1000 MG capsule Take 1 g by mouth daily.  . metoprolol (LOPRESSOR) 50 MG tablet TAKE 1 TABLET BY MOUTH TWICE A DAY  . rosuvastatin (CRESTOR) 20 MG tablet TAKE 1 TABLET EVERY DAY  . tamsulosin (FLOMAX) 0.4 MG CAPS capsule Take 0.4 mg by mouth daily after supper.  . warfarin (COUMADIN) 5 MG tablet TAKE 1 AND 1/2 TABLET BY MOUTH DAILY AS DIRECTED   No facility-administered encounter medications on file as of 06/29/2016.      Allergies: No Known Allergies  Family History: Family History  Problem Relation Age of Onset  . Emphysema Mother 46  . Heart failure Father   . Alzheimer's disease Father 29    Social History: Social History  Substance Use Topics  . Smoking status: Never Smoker  . Smokeless tobacco: Never Used  . Alcohol use No   Social History   Social History Narrative   Lives with wife in a one story home.  Has one son.  Retired Curator for [employer].     Education: college.     Review of Systems:  CONSTITUTIONAL: No fevers, chills, night sweats, or weight loss.   EYES: No visual changes or eye pain ENT: No hearing changes.  No  history of nose bleeds.   RESPIRATORY: No cough, wheezing and shortness of breath.   CARDIOVASCULAR: Negative for chest pain, and palpitations.   GI: Negative for abdominal discomfort, blood in stools or black stools.  No recent change in bowel habits.   GU:  No history of incontinence.   MUSCLOSKELETAL: No history of joint pain or swelling.  No myalgias.   SKIN: Negative for lesions, rash, and itching.   HEMATOLOGY/ONCOLOGY: Negative for prolonged bleeding, bruising easily, and swollen nodes.  No history of cancer.   ENDOCRINE: Negative for cold or heat intolerance, polydipsia or goiter.   PSYCH:  No depression or anxiety symptoms.   NEURO: As Above.   Vital Signs:  BP 118/78   Pulse  87   Ht 6\' 3"  (1.905 m)   Wt 283 lb 7 oz (128.6 kg)   SpO2 96%   BMI 35.43 kg/m    General Medical Exam:   General:  Well appearing, comfortable.   Eyes/ENT: see cranial nerve examination.   Neck: No masses appreciated.  Full range of motion without tenderness.  No carotid bruits. Respiratory:  Clear to auscultation, good air entry bilaterally.   Cardiac:  Regular rate and rhythm, no murmur.   Extremities:  No deformities, edema, or skin discoloration.  Skin:  No rashes or lesions.  Neurological Exam: MENTAL STATUS including orientation to time, place, person, recent and remote memory, attention span and concentration, language, and fund of knowledge is normal.  Speech is not dysarthric.  CRANIAL NERVES: II:  No visual field defects.  Unremarkable fundi.   III-IV-VI: Pupils equal round and reactive to light.  Normal conjugate, extra-ocular eye movements in all directions of gaze.  No nystagmus.  No ptosis.   V:  Normal facial sensation.   VII:  Normal facial symmetry and movements.  VIII:  Normal hearing and vestibular function.   IX-X:  Normal palatal movement.   XI:  Normal shoulder shrug and head rotation.   XII:  Normal tongue strength and range of motion, no deviation or fasciculation.  MOTOR:  No atrophy, fasciculations or abnormal movements.  No pronator drift.  Tone is normal.    Right Upper Extremity:    Left Upper Extremity:    Deltoid  5/5   Deltoid  5/5   Biceps  5/5   Biceps  5/5   Triceps  5/5   Triceps  5/5   Wrist extensors  5/5   Wrist extensors  5/5   Wrist flexors  5/5   Wrist flexors  5/5   Finger extensors  5/5   Finger extensors  5/5   Finger flexors  5/5   Finger flexors  5/5   Dorsal interossei  5/5   Dorsal interossei  5/5   Abductor pollicis  5/5   Abductor pollicis  5/5   Tone (Ashworth scale)  0  Tone (Ashworth scale)  0   Right Lower Extremity:    Left Lower Extremity:    Hip flexors  5/5   Hip flexors  5/5   Hip extensors  5/5   Hip extensors   5/5   Knee flexors  5/5   Knee flexors  5/5   Knee extensors  5/5   Knee extensors  5/5   Dorsiflexors  5/5   Dorsiflexors  5/5   Plantarflexors  5/5   Plantarflexors  5/5   Toe extensors  5/5   Toe extensors  5/5   Toe flexors  5/5   Toe flexors  5/5   Tone (Ashworth scale)  0  Tone (Ashworth scale)  0   MSRs:  Right                                                                 Left brachioradialis 2+  brachioradialis 2+  biceps 2+  biceps 2+  triceps 2+  triceps 2+  patellar 2+  patellar 2+  ankle jerk 1+  ankle jerk 1+  Hoffman no  Hoffman no  plantar response down  plantar response down   SENSORY:  Light touch, temperature, and pin prick absent over the right distal half of the medial plantar nerve distribution. Otherwise, normal and symmetric perception of light touch, pinprick, vibration, and proprioception.     COORDINATION/GAIT: Normal finger-to- nose-finger.  Intact rapid alternating movements bilaterally. Gait narrow based and stable.  Stressed gait intact. Tandem gait is somewhat unsteady initially.   IMPRESSION: Gerald Frey is a 71 year-old gentleman referred for evaluation of numbness involving the right medial sole of the foot.  His exam and history is most consistent with an isolated right medial plantar neuropathy.  Lack of involvement of the lateral plantar nerve and absent weakness of the foot makes tarsal tunnel syndrome unlikely.  Etiology of nerve injury isolated to the right medial plantar nerve is uncommon.  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  Fortunately, he does not have any burning paresthesias or tingling and symptoms are only numbness.  It was explained that NCS/EMG can be performed to look for medial plantar responses, however, these responses are often absent in normal individuals as they are very small and technically difficult to obtain.  Therefore,  the yield of testing is low and would unlikely change management.  If symptoms progressed to involve the lateral sole, tarsal tunnel syndrome would be a possibility and we can explore further with electrodiagnostic testing.  MRI of the right foot could also show any structural changes possibly causing impingement of the nerve, but given mild symptoms, patient declined additional testing.  In the meantime, labs for treatable causes of neuropathy will be checked and include vitamin B12, copper, and TSH.  If he develops burning paresthesias, he may try OTC lidocaine ointments.  Return to clinic as needed  The duration of this appointment visit was 35 minutes of face-to-face time with the patient.  Greater than 50% of this time was spent in counseling, explanation of diagnosis, planning of further management, and coordination of care.   Thank you for allowing me to participate in patient's care.  If I can answer any additional questions, I would be pleased to do so.    Sincerely,    Rheannon Cerney K. Posey Pronto, DO

## 2016-07-01 ENCOUNTER — Telehealth: Payer: Self-pay | Admitting: *Deleted

## 2016-07-01 LAB — COPPER, SERUM: Copper: 98 ug/dL (ref 70–175)

## 2016-07-01 NOTE — Telephone Encounter (Signed)
-----   Message from Alda Berthold, DO sent at 07/01/2016  9:14 AM EDT ----- Please notify patient lab are within normal limits.  Thank you.

## 2016-07-01 NOTE — Telephone Encounter (Signed)
Patient given results

## 2016-07-15 ENCOUNTER — Ambulatory Visit (INDEPENDENT_AMBULATORY_CARE_PROVIDER_SITE_OTHER): Payer: Medicare Other | Admitting: Pharmacist Clinician (PhC)/ Clinical Pharmacy Specialist

## 2016-07-15 DIAGNOSIS — I4891 Unspecified atrial fibrillation: Secondary | ICD-10-CM

## 2016-07-15 DIAGNOSIS — Z7901 Long term (current) use of anticoagulants: Secondary | ICD-10-CM

## 2016-07-15 LAB — POCT INR: INR: 2.9

## 2016-07-24 ENCOUNTER — Other Ambulatory Visit: Payer: Self-pay | Admitting: Internal Medicine

## 2016-08-15 ENCOUNTER — Other Ambulatory Visit: Payer: Self-pay | Admitting: Internal Medicine

## 2016-08-22 ENCOUNTER — Other Ambulatory Visit: Payer: Self-pay | Admitting: Internal Medicine

## 2016-08-25 ENCOUNTER — Ambulatory Visit (INDEPENDENT_AMBULATORY_CARE_PROVIDER_SITE_OTHER): Payer: Medicare Other | Admitting: Pharmacist

## 2016-08-25 DIAGNOSIS — I4891 Unspecified atrial fibrillation: Secondary | ICD-10-CM | POA: Diagnosis not present

## 2016-08-25 DIAGNOSIS — Z7901 Long term (current) use of anticoagulants: Secondary | ICD-10-CM

## 2016-08-25 LAB — POCT INR: INR: 3.7

## 2016-09-08 ENCOUNTER — Other Ambulatory Visit: Payer: Self-pay | Admitting: Internal Medicine

## 2016-09-15 ENCOUNTER — Ambulatory Visit (INDEPENDENT_AMBULATORY_CARE_PROVIDER_SITE_OTHER): Payer: Medicare Other | Admitting: Pharmacist

## 2016-09-15 DIAGNOSIS — I4891 Unspecified atrial fibrillation: Secondary | ICD-10-CM

## 2016-09-15 DIAGNOSIS — Z7901 Long term (current) use of anticoagulants: Secondary | ICD-10-CM | POA: Diagnosis not present

## 2016-09-15 LAB — POCT INR: INR: 4.1

## 2016-09-30 ENCOUNTER — Ambulatory Visit (INDEPENDENT_AMBULATORY_CARE_PROVIDER_SITE_OTHER): Payer: Medicare Other | Admitting: Pharmacist Clinician (PhC)/ Clinical Pharmacy Specialist

## 2016-09-30 DIAGNOSIS — I4891 Unspecified atrial fibrillation: Secondary | ICD-10-CM | POA: Diagnosis not present

## 2016-09-30 DIAGNOSIS — Z7901 Long term (current) use of anticoagulants: Secondary | ICD-10-CM | POA: Diagnosis not present

## 2016-09-30 LAB — POCT INR: INR: 2.8

## 2016-10-26 ENCOUNTER — Ambulatory Visit (INDEPENDENT_AMBULATORY_CARE_PROVIDER_SITE_OTHER): Payer: Medicare Other | Admitting: Pharmacist

## 2016-10-26 DIAGNOSIS — I4891 Unspecified atrial fibrillation: Secondary | ICD-10-CM | POA: Diagnosis not present

## 2016-10-26 DIAGNOSIS — Z7901 Long term (current) use of anticoagulants: Secondary | ICD-10-CM | POA: Diagnosis not present

## 2016-10-26 LAB — POCT INR: INR: 2.7

## 2016-11-13 ENCOUNTER — Other Ambulatory Visit: Payer: Self-pay | Admitting: Internal Medicine

## 2016-11-16 ENCOUNTER — Ambulatory Visit: Payer: Medicare Other | Admitting: Neurology

## 2016-12-12 ENCOUNTER — Ambulatory Visit (INDEPENDENT_AMBULATORY_CARE_PROVIDER_SITE_OTHER): Payer: Medicare Other | Admitting: Pharmacist Clinician (PhC)/ Clinical Pharmacy Specialist

## 2016-12-12 DIAGNOSIS — I4891 Unspecified atrial fibrillation: Secondary | ICD-10-CM

## 2016-12-12 DIAGNOSIS — Z7901 Long term (current) use of anticoagulants: Secondary | ICD-10-CM

## 2016-12-12 LAB — POCT INR: INR: 3

## 2016-12-24 ENCOUNTER — Other Ambulatory Visit: Payer: Self-pay | Admitting: Internal Medicine

## 2017-01-23 ENCOUNTER — Ambulatory Visit (INDEPENDENT_AMBULATORY_CARE_PROVIDER_SITE_OTHER): Payer: Medicare Other | Admitting: Pharmacist Clinician (PhC)/ Clinical Pharmacy Specialist

## 2017-01-23 DIAGNOSIS — Z7901 Long term (current) use of anticoagulants: Secondary | ICD-10-CM | POA: Diagnosis not present

## 2017-01-23 DIAGNOSIS — I4891 Unspecified atrial fibrillation: Secondary | ICD-10-CM | POA: Diagnosis not present

## 2017-01-23 LAB — POCT INR: INR: 2.7

## 2017-02-11 ENCOUNTER — Other Ambulatory Visit: Payer: Self-pay | Admitting: Internal Medicine

## 2017-02-23 ENCOUNTER — Other Ambulatory Visit: Payer: Self-pay | Admitting: Internal Medicine

## 2017-02-23 NOTE — Telephone Encounter (Signed)
Rx(s) sent to pharmacy electronically.  

## 2017-03-06 ENCOUNTER — Ambulatory Visit (INDEPENDENT_AMBULATORY_CARE_PROVIDER_SITE_OTHER): Payer: Medicare Other | Admitting: Pharmacist

## 2017-03-06 DIAGNOSIS — Z7901 Long term (current) use of anticoagulants: Secondary | ICD-10-CM | POA: Diagnosis not present

## 2017-03-06 DIAGNOSIS — I4891 Unspecified atrial fibrillation: Secondary | ICD-10-CM | POA: Diagnosis not present

## 2017-03-06 LAB — POCT INR: INR: 2.6

## 2017-04-03 ENCOUNTER — Ambulatory Visit (INDEPENDENT_AMBULATORY_CARE_PROVIDER_SITE_OTHER): Payer: Medicare Other | Admitting: Pharmacist Clinician (PhC)/ Clinical Pharmacy Specialist

## 2017-04-03 ENCOUNTER — Other Ambulatory Visit: Payer: Self-pay | Admitting: Internal Medicine

## 2017-04-03 ENCOUNTER — Ambulatory Visit: Payer: Medicare Other | Admitting: Internal Medicine

## 2017-04-03 ENCOUNTER — Encounter: Payer: Self-pay | Admitting: Internal Medicine

## 2017-04-03 VITALS — BP 129/82 | HR 79 | Ht 75.0 in | Wt 282.2 lb

## 2017-04-03 DIAGNOSIS — I255 Ischemic cardiomyopathy: Secondary | ICD-10-CM | POA: Diagnosis not present

## 2017-04-03 DIAGNOSIS — I4891 Unspecified atrial fibrillation: Secondary | ICD-10-CM

## 2017-04-03 DIAGNOSIS — Z7901 Long term (current) use of anticoagulants: Secondary | ICD-10-CM

## 2017-04-03 DIAGNOSIS — E785 Hyperlipidemia, unspecified: Secondary | ICD-10-CM | POA: Diagnosis not present

## 2017-04-03 LAB — POCT INR: INR: 2.6

## 2017-04-03 NOTE — Patient Instructions (Signed)
Your physician recommends that you return for lab work fasting to check cholesterol   Your physician wants you to follow-up in: ONE YEAR with Dr. Hilty. You will receive a reminder letter in the mail two months in advance. If you don't receive a letter, please call our office to schedule the follow-up appointment.   

## 2017-04-03 NOTE — Progress Notes (Signed)
[name] Kitchen               OFFICE NOTE  Chief Complaint:  Annual visit  Primary Care Physician: Deland Pretty, MD  HPI:  Gerald Frey is a 71 year old gentleman who I am following for history of chronic A-fib on Coumadin, hypertension, dyslipidemia. Overall, again doing fairly well without any new concerns today. He denies any shortness of breath, palpitations, presyncope, or syncopal symptoms. INR in the office today was 3.0 which is stable. This is a history of coronary disease and had a stent to the LAD in 2005.  He has no anginal complaints. Recently he has missed some of his medications and he can definitely tell he feels worse, probably because of RVR.  However, he has been chopping wood without difficulty.  I saw [name] back in the office today. He is again without complaints. He denies any chest pain or worsening shortness of breath. He remains in chronic atrial fibrillation with an INR of 2.8 in the office. He's been very stable we discussed possibly changing to a direct oral anticoagulant before, but he prefers to stay on warfarin.  04/01/2016  [name] returns today for follow-up. He reports feeling well, although has gained weight over the last year. He denies any chest pain or worsening shortness of breath. His INR checked today was 2.9. It's been very stable. We again discussed possibly switching to a novel oral anticoagulant, however he prefers to stay on warfarin. Since he's been very well controlled, it's difficult to argue that there would be additional benefit from a NOAC. He's not had any significant bleeding complications. His last LV assessment was in 2009 when EF was 45-50% at that time. He is overdue for cholesterol testing as well.  04/03/2017  [name] was seen today in follow-up.  Is been a year since we last saw him.  He has been working on weight loss and is 1 pound lighter than we saw him last year which was lower than his previous weight.  He  denies any chest pain or worsening shortness of breath.  Blood pressure is well controlled today.  He is in long-standing persistent if not permanent atrial fibrillation.  He wishes to remain on warfarin and has had very stable INRs.  Ventricular rate is well controlled at 79 today.  LVEF normalized on his last echo in January 2018.  PMHx:  Past Medical History:  Diagnosis Date  . Atrial fibrillation ([place]) 06/19/2007   2D Echo EF=45-50%  . CAD (coronary artery disease) 2005   Stents.  . Dyslipidemia   . Hypertension   . Palpitation     Past Surgical History:  Procedure Laterality Date  . APPENDECTOMY  01/2000  . CARDIAC CATHETERIZATION  03/18/2004   CYPHER 3.5x24mm stent in the proximal LAD  . COLONOSCOPY  07/2004   hx colon polyps  . KNEE CARTILAGE SURGERY  11/2002    FAMHx:  Family History  Problem Relation Age of Onset  . Emphysema Mother 3  . Heart failure Father   . Alzheimer's disease Father 39    SOCHx:   reports that  has never smoked. he has never used smokeless tobacco. He reports that he does not drink alcohol or use drugs.  ALLERGIES:  No Known Allergies  ROS: Pertinent items noted in HPI and remainder of comprehensive ROS otherwise negative.  HOME MEDS: Current Outpatient Medications  Medication Sig Dispense Refill  . aspirin 81 MG tablet Take 81 mg by mouth daily.    [name] Kitchen  cetirizine (ZYRTEC) 10 MG tablet Take 10 mg by mouth as needed.     . fish oil-omega-3 fatty acids 1000 MG capsule Take 1 g by mouth 2 (two) times daily.     . metoprolol tartrate (LOPRESSOR) 50 MG tablet TAKE 1 TABLET BY MOUTH TWICE A DAY 60 tablet 3  . rosuvastatin (CRESTOR) 20 MG tablet TAKE 1 TABLET EVERY DAY 30 tablet 10  . tamsulosin (FLOMAX) 0.4 MG CAPS capsule Take 0.4 mg by mouth daily after supper.    . warfarin (COUMADIN) 5 MG tablet TAKE 1 TABLET (5 MG TOTAL) BY MOUTH DAILY. OR AS DIRECTED BY COUMADIN CLINIC 90 tablet 1  . diltiazem (CARDIZEM CD) 120 MG 24 hr capsule TAKE  ONE CAPSULE BY MOUTH EVERY DAY 30 capsule 11   No current facility-administered medications for this visit.     LABS/IMAGING: No results found for this or any previous visit (from the past 48 hour(s)). No results found.  VITALS: BP 129/82   Pulse 79   Ht 6\' 3"  (1.905 m)   Wt 282 lb 3.2 oz (128 kg)   BMI 35.27 kg/m   EXAM: General appearance: alert and no distress Neck: no adenopathy, no carotid bruit, no JVD, supple, symmetrical, trachea midline and thyroid not enlarged, symmetric, no tenderness/mass/nodules Lungs: clear to auscultation bilaterally Heart: regular rate and rhythm, S1, S2 normal, no murmur, click, rub or gallop Abdomen: soft, non-tender; bowel sounds normal; no masses,  no organomegaly Extremities: extremities normal, atraumatic, no cyanosis or edema Pulses: 2+ and symmetric Skin: Skin color, texture, turgor normal. No rashes or lesions Neurologic: Grossly normal  EKG: Atrial fibrillation at 79-personally reviewed  ASSESSMENT: 1. Coronary artery disease status post PCI of the LAD 2005 2. Permanent atrial fibrillation on warfarin - CHADSVASC 2 (HTN, CHF) on warfarin 3. History of ischemic cardiomyopathy, EF 45-50% (2009), up to 55-60% (2018) 4. Hypertension 5. Dyslipidemia  PLAN: 1.   Gerald Frey has had no active symptoms of coronary artery disease.  He has permanent A. fib on warfarin which has been well controlled.  He prefers to stay on that.  He has an ischemic cardiomyopathy with EF 45-50% in 2009 however his LVEF has normalized up to 55-60%.  Blood pressure is at goal today.  He is due for repeat lipid profile which we will order and adjust medicines accordingly.  Plan otherwise follow-up annually or sooner as necessary.  Pixie Casino, MD, [facility], [place] Director of the [facility] Lipid [facility] &  Card[facility]ovascular [facility] Reduction Clinic Attending Cardiologist  Direct Dial: [phone]  Fax: [phone]    Website:  [email or web address] 04/03/2017, 1:07 PM

## 2017-04-06 LAB — LIPID PANEL
CHOL/HDL RATIO: 3.5 ratio (ref 0.0–5.0)
Cholesterol, Total: 137 mg/dL (ref 100–199)
HDL: 39 mg/dL — ABNORMAL LOW (ref >39)
LDL Calculated: 68 mg/dL (ref 0–99)
TRIGLYCERIDES: 150 mg/dL — AB (ref 0–149)
VLDL Cholesterol Cal: 30 mg/dL (ref 5–40)

## 2017-04-14 ENCOUNTER — Encounter: Payer: Self-pay | Admitting: Internal Medicine

## 2017-04-24 ENCOUNTER — Other Ambulatory Visit: Payer: Self-pay

## 2017-04-24 MED ORDER — METOPROLOL TARTRATE 50 MG PO TABS: 50.0000 mg | ORAL_TABLET | Freq: Two times a day (BID) | ORAL | 3 refills | Status: DC

## 2017-04-24 NOTE — Telephone Encounter (Signed)
Rx(s) sent to pharmacy electronically.  

## 2017-05-15 ENCOUNTER — Ambulatory Visit (INDEPENDENT_AMBULATORY_CARE_PROVIDER_SITE_OTHER): Payer: Medicare Other | Admitting: Pharmacist

## 2017-05-15 DIAGNOSIS — Z7901 Long term (current) use of anticoagulants: Secondary | ICD-10-CM | POA: Diagnosis not present

## 2017-05-15 DIAGNOSIS — I4891 Unspecified atrial fibrillation: Secondary | ICD-10-CM | POA: Diagnosis not present

## 2017-05-15 LAB — POCT INR: INR: 3.5

## 2017-06-12 ENCOUNTER — Ambulatory Visit (INDEPENDENT_AMBULATORY_CARE_PROVIDER_SITE_OTHER): Payer: Medicare Other | Admitting: Pharmacist Clinician (PhC)/ Clinical Pharmacy Specialist

## 2017-06-12 DIAGNOSIS — Z7901 Long term (current) use of anticoagulants: Secondary | ICD-10-CM

## 2017-06-12 DIAGNOSIS — I4891 Unspecified atrial fibrillation: Secondary | ICD-10-CM | POA: Diagnosis not present

## 2017-06-12 LAB — POCT INR: INR: 2.8

## 2017-07-10 ENCOUNTER — Ambulatory Visit (INDEPENDENT_AMBULATORY_CARE_PROVIDER_SITE_OTHER): Payer: Medicare Other | Admitting: Pharmacist Clinician (PhC)/ Clinical Pharmacy Specialist

## 2017-07-10 DIAGNOSIS — I4891 Unspecified atrial fibrillation: Secondary | ICD-10-CM

## 2017-07-10 DIAGNOSIS — Z7901 Long term (current) use of anticoagulants: Secondary | ICD-10-CM

## 2017-07-10 LAB — POCT INR: INR: 1.8

## 2017-08-07 ENCOUNTER — Ambulatory Visit (INDEPENDENT_AMBULATORY_CARE_PROVIDER_SITE_OTHER): Payer: Medicare Other | Admitting: Pharmacist

## 2017-08-07 ENCOUNTER — Other Ambulatory Visit: Payer: Self-pay | Admitting: Internal Medicine

## 2017-08-07 DIAGNOSIS — Z7901 Long term (current) use of anticoagulants: Secondary | ICD-10-CM

## 2017-08-07 DIAGNOSIS — I4891 Unspecified atrial fibrillation: Secondary | ICD-10-CM | POA: Diagnosis not present

## 2017-08-07 LAB — POCT INR: INR: 2.9

## 2017-09-12 ENCOUNTER — Ambulatory Visit (INDEPENDENT_AMBULATORY_CARE_PROVIDER_SITE_OTHER): Payer: Medicare Other | Admitting: Pharmacist Clinician (PhC)/ Clinical Pharmacy Specialist

## 2017-09-12 DIAGNOSIS — I4891 Unspecified atrial fibrillation: Secondary | ICD-10-CM | POA: Diagnosis not present

## 2017-09-12 DIAGNOSIS — Z7901 Long term (current) use of anticoagulants: Secondary | ICD-10-CM | POA: Diagnosis not present

## 2017-09-12 LAB — POCT INR: INR: 2.5 (ref 2.0–3.0)

## 2017-09-12 NOTE — Patient Instructions (Signed)
Description   Continue with 1 tablet daily. Repeat INR in  6 weeks      

## 2017-10-23 ENCOUNTER — Ambulatory Visit (INDEPENDENT_AMBULATORY_CARE_PROVIDER_SITE_OTHER): Payer: Medicare Other | Admitting: Pharmacist Clinician (PhC)/ Clinical Pharmacy Specialist

## 2017-10-23 DIAGNOSIS — Z7901 Long term (current) use of anticoagulants: Secondary | ICD-10-CM | POA: Diagnosis not present

## 2017-10-23 DIAGNOSIS — I4891 Unspecified atrial fibrillation: Secondary | ICD-10-CM | POA: Diagnosis not present

## 2017-10-23 LAB — POCT INR: INR: 1.9 — AB (ref 2.0–3.0)

## 2017-10-23 NOTE — Patient Instructions (Signed)
Description   Take 1.5 tablets today Monday July 8, then continue with 1 tablet daily. Repeat INR in 6 weeks

## 2017-11-09 ENCOUNTER — Telehealth: Payer: Self-pay | Admitting: Internal Medicine

## 2017-11-09 NOTE — Telephone Encounter (Signed)
Patient c/o that for the past few days he's felt palpitations, heart beating stronger & skipping & also c/o a little sob. No c/o chest pain or dizziness. Patient thinks he is in a-fib again. Patient given appointment to be seen in the a-fib clinic tomorrow @9 :30 am. Garage code given to patient and phone number to clinic to get instructions. Verbalized understanding.

## 2017-11-09 NOTE — Telephone Encounter (Signed)
Thanks - Dr H 

## 2017-11-09 NOTE — Telephone Encounter (Signed)
New Message     Patient states he is in A-fib. He states it has being acting up for the last 3 days. He would like to come in before 11/18/2017 because [place] is going [place] the [place]. Pls advise.

## 2017-11-10 ENCOUNTER — Encounter (HOSPITAL_COMMUNITY): Payer: Self-pay | Admitting: Nurse Practitioner

## 2017-11-10 ENCOUNTER — Ambulatory Visit (HOSPITAL_COMMUNITY)
Admission: RE | Admit: 2017-11-10 | Discharge: 2017-11-10 | Disposition: A | Discharge status: Home / Self Care | Payer: Medicare Other | Source: Ambulatory Visit | Attending: Nurse Practitioner | Admitting: Nurse Practitioner

## 2017-11-10 VITALS — BP 118/76 | HR 77 | Ht 75.0 in | Wt 276.0 lb

## 2017-11-10 DIAGNOSIS — R9431 Abnormal electrocardiogram [ECG] [EKG]: Secondary | ICD-10-CM | POA: Insufficient documentation

## 2017-11-10 DIAGNOSIS — I482 Chronic atrial fibrillation: Secondary | ICD-10-CM | POA: Diagnosis present

## 2017-11-10 DIAGNOSIS — Z7901 Long term (current) use of anticoagulants: Secondary | ICD-10-CM | POA: Insufficient documentation

## 2017-11-10 DIAGNOSIS — I10 Essential (primary) hypertension: Secondary | ICD-10-CM | POA: Insufficient documentation

## 2017-11-10 DIAGNOSIS — Z79899 Other long term (current) drug therapy: Secondary | ICD-10-CM | POA: Insufficient documentation

## 2017-11-10 DIAGNOSIS — R002 Palpitations: Secondary | ICD-10-CM | POA: Diagnosis present

## 2017-11-10 DIAGNOSIS — I2510 Atherosclerotic heart disease of native coronary artery without angina pectoris: Secondary | ICD-10-CM | POA: Diagnosis not present

## 2017-11-10 DIAGNOSIS — Z86010 Personal history of colonic polyps: Secondary | ICD-10-CM | POA: Diagnosis not present

## 2017-11-10 DIAGNOSIS — R079 Chest pain, unspecified: Secondary | ICD-10-CM | POA: Diagnosis not present

## 2017-11-10 DIAGNOSIS — I4821 Permanent atrial fibrillation: Secondary | ICD-10-CM

## 2017-11-10 DIAGNOSIS — R0789 Other chest pain: Secondary | ICD-10-CM | POA: Diagnosis not present

## 2017-11-10 DIAGNOSIS — Z955 Presence of coronary angioplasty implant and graft: Secondary | ICD-10-CM | POA: Insufficient documentation

## 2017-11-10 DIAGNOSIS — E785 Hyperlipidemia, unspecified: Secondary | ICD-10-CM | POA: Insufficient documentation

## 2017-11-10 LAB — CBC
HEMATOCRIT: 50.1 % (ref 39.0–52.0)
HEMOGLOBIN: 16.5 g/dL (ref 13.0–17.0)
MCH: 30.6 pg (ref 26.0–34.0)
MCHC: 32.9 g/dL (ref 30.0–36.0)
MCV: 92.8 fL (ref 78.0–100.0)
Platelets: 173 10*3/uL (ref 150–400)
RBC: 5.4 MIL/uL (ref 4.22–5.81)
RDW: 13 % (ref 11.5–15.5)
WBC: 6.1 10*3/uL (ref 4.0–10.5)

## 2017-11-10 LAB — COMPREHENSIVE METABOLIC PANEL
ALK PHOS: 38 U/L (ref 38–126)
ALT: 21 U/L (ref 0–44)
ANION GAP: 5 (ref 5–15)
AST: 26 U/L (ref 15–41)
Albumin: 3.9 g/dL (ref 3.5–5.0)
BILIRUBIN TOTAL: 0.9 mg/dL (ref 0.3–1.2)
BUN: 12 mg/dL (ref 8–23)
CALCIUM: 9.2 mg/dL (ref 8.9–10.3)
CO2: 26 mmol/L (ref 22–32)
Chloride: 111 mmol/L (ref 98–111)
Creatinine, Ser: 1.18 mg/dL (ref 0.61–1.24)
GFR, EST NON AFRICAN AMERICAN: 60 mL/min — AB (ref >60)
GLUCOSE: 125 mg/dL — AB (ref 70–99)
Potassium: 4.4 mmol/L (ref 3.5–5.1)
Sodium: 142 mmol/L (ref 135–145)
TOTAL PROTEIN: 6.9 g/dL (ref 6.5–8.1)

## 2017-11-10 LAB — MAGNESIUM: Magnesium: 2.4 mg/dL (ref 1.7–2.4)

## 2017-11-10 LAB — TSH: TSH: 3.457 u[IU]/mL (ref 0.350–4.500)

## 2017-11-10 NOTE — Progress Notes (Signed)
Primary Care Physician: Deland Pretty, MD Referring Physician: Dr. Blair Frey is a 72 y.o. male with a h/o permanent afib since at least 2013, that is in the afib clinic for feeling palpitations,that he is usually not aware of. This started after working outside pretty heavily for a couple days getting caught up on yard work. He is rate controlled in the 70's today. He also admits that he has noted that he feels somewhat winded with activities and has noted some exertional chest tightness for the last few months. He does  have a stent of the LAD placed in 2005. He states now that he thinks about it, these are similar symptoms that he had before stent placement. He is symptom free in the clinic.   Today, he denies symptoms of  orthopnea, PND, lower extremity edema, dizziness, presyncope, syncope, or neurologic sequela. The patient is tolerating medications without difficulties and is otherwise without complaint today.   Past Medical History:  Diagnosis Date  . Atrial fibrillation ([place]) 06/19/2007   2D Echo EF=45-50%  . CAD (coronary artery disease) 2005   Stents.  . Dyslipidemia   . Hypertension   . Palpitation    Past Surgical History:  Procedure Laterality Date  . APPENDECTOMY  01/2000  . CARDIAC CATHETERIZATION  03/18/2004   CYPHER 3.5x6mm stent in the proximal LAD  . COLONOSCOPY  07/2004   hx colon polyps  . KNEE CARTILAGE SURGERY  11/2002    Current Outpatient Medications  Medication Sig Dispense Refill  . aspirin 81 MG tablet Take 81 mg by mouth daily.    . cetirizine (ZYRTEC) 10 MG tablet Take 10 mg by mouth as needed.     . diltiazem (CARDIZEM CD) 120 MG 24 hr capsule TAKE ONE CAPSULE BY MOUTH EVERY DAY 30 capsule 11  . fish oil-omega-3 fatty acids 1000 MG capsule Take 1 g by mouth 2 (two) times daily.     . metoprolol tartrate (LOPRESSOR) 50 MG tablet Take 1 tablet (50 mg total) by mouth 2 (two) times daily. 180 tablet 3  . rosuvastatin (CRESTOR) 20  MG tablet TAKE 1 TABLET EVERY DAY 30 tablet 10  . tamsulosin (FLOMAX) 0.4 MG CAPS capsule Take 0.4 mg by mouth daily after supper.    . warfarin (COUMADIN) 5 MG tablet TAKE 1 TABLET (5 MG TOTAL) BY MOUTH DAILY. OR AS DIRECTED BY COUMADIN CLINIC 90 tablet 1   No current facility-administered medications for this encounter.     No Known Allergies  Social History   Socioeconomic History  . Marital status: Married    Spouse name: Not on file  . Number of children: 1  . Years of education: college  . Highest education level: Not on file  Occupational History  . Occupation: retired  Scientific laboratory technician  . Financial resource strain: Not on file  . Food insecurity:    Worry: Not on file    Inability: Not on file  . Transportation needs:    Medical: Not on file    Non-medical: Not on file  Tobacco Use  . Smoking status: Never Smoker  . Smokeless tobacco: Never Used  Substance and Sexual Activity  . Alcohol use: No  . Drug use: No  . Sexual activity: Not on file  Lifestyle  . Physical activity:    Days per week: Not on file    Minutes per session: Not on file  . Stress: Not on file  Relationships  . Social connections:  Talks on phone: Not on file    Gets together: Not on file    Attends religious service: Not on file    Active member of club or organization: Not on file    Attends meetings of clubs or organizations: Not on file    Relationship status: Not on file  . Intimate partner violence:    Fear of current or ex partner: Not on file    Emotionally abused: Not on file    Physically abused: Not on file    Forced sexual activity: Not on file  Other Topics Concern  . Not on file  Social History Narrative   Lives with wife in a one story home.  Has one son.  Retired Curator for [employer].     Education: college.     Family History  Problem Relation Age of Onset  . Emphysema Mother 3  . Heart failure Father   . Alzheimer's disease Father 43    ROS-  All systems are reviewed and negative except as per the HPI above  Physical Exam: Vitals:   11/10/17 0936  BP: 118/76  Pulse: 77  Weight: 276 lb (125.2 kg)  Height: 6\' 3"  (1.905 m)   Wt Readings from Last 3 Encounters:  11/10/17 276 lb (125.2 kg)  04/03/17 282 lb 3.2 oz (128 kg)  06/29/16 283 lb 7 oz (128.6 kg)    Labs: Lab Results  Component Value Date   NA 142 11/10/2017   K 4.4 11/10/2017   CL 111 11/10/2017   CO2 26 11/10/2017   GLUCOSE 125 (H) 11/10/2017   BUN 12 11/10/2017   CREATININE 1.18 11/10/2017   CALCIUM 9.2 11/10/2017   MG 2.4 11/10/2017   Lab Results  Component Value Date   INR 1.9 (A) 10/23/2017   Lab Results  Component Value Date   CHOL 137 04/06/2017   HDL 39 (L) 04/06/2017   LDLCALC 68 04/06/2017   TRIG 150 (H) 04/06/2017     GEN- The patient is well appearing, alert and oriented x 3 today.   Head- normocephalic, atraumatic Eyes-  Sclera clear, conjunctiva pink Ears- hearing intact Oropharynx- clear Neck- supple, no JVP Lymph- no cervical lymphadenopathy Lungs- Clear to ausculation bilaterally, normal work of breathing Heart- Regular rate and rhythm, no murmurs, rubs or gallops, PMI not laterally displaced GI- soft, NT, ND, + BS Extremities- no clubbing, cyanosis, or edema MS- no significant deformity or atrophy Skin- no rash or lesion Psych- euthymic mood, full affect Neuro- strength and sensation are intact  EKG-Sinus brady at 59 bpm, IRBBB, LAFB, pr itn 195 ms, qrs int 94 ms, qtc 407 ms 04/2016-Study Conclusions  - Left ventricle: The cavity size was normal. Wall thickness was   increased in a pattern of mild LVH. There was mild focal basal   hypertrophy of the septum. Systolic function was normal. The   estimated ejection fraction was in the range of 55% to 60%. Left   ventricular diastolic function parameters were normal. - Left atrium: The atrium was moderately dilated. - Atrial septum: No defect or patent foramen ovale was  identified.    Assessment and Plan: 1. Permanent afib Pt is rate controlled but has noted sensation of palpitations in his throat recently, as well as some exertional shortness of breath and chest tightness with h/o CAD, remote stent of LAD I am concerned that his symptoms are not related to his afib as he has been rate controlled for years, possibly ischemic in  nature Will update echo, order lexi Myoview Labs today cmet, tsh, cbc I will not change rate control meds as he is well  rate controlled Continue warfarin with last INR at 1.9, per coumadin clinic  F/u with Dr.  Debara Pickett in the next 2-4 weeks   Butch Penny C. Keshonda Monsour, [facility] [address] [place], [place] [address] [phone]

## 2017-11-10 NOTE — Patient Instructions (Signed)
Scheduling will call you to set up follow up with Dr. Debara Pickett within the month

## 2017-11-13 ENCOUNTER — Ambulatory Visit (HOSPITAL_COMMUNITY)
Admission: RE | Admit: 2017-11-13 | Discharge: 2017-11-13 | Disposition: A | Discharge status: Home / Self Care | Payer: Medicare Other | Source: Ambulatory Visit | Attending: Nurse Practitioner | Admitting: Nurse Practitioner

## 2017-11-13 ENCOUNTER — Telehealth (HOSPITAL_COMMUNITY): Payer: Self-pay | Admitting: *Deleted

## 2017-11-13 DIAGNOSIS — I482 Chronic atrial fibrillation: Secondary | ICD-10-CM | POA: Insufficient documentation

## 2017-11-13 DIAGNOSIS — I4821 Permanent atrial fibrillation: Secondary | ICD-10-CM

## 2017-11-13 DIAGNOSIS — I4891 Unspecified atrial fibrillation: Secondary | ICD-10-CM | POA: Diagnosis present

## 2017-11-13 DIAGNOSIS — I77810 Thoracic aortic ectasia: Secondary | ICD-10-CM | POA: Diagnosis not present

## 2017-11-13 DIAGNOSIS — I517 Cardiomegaly: Secondary | ICD-10-CM | POA: Insufficient documentation

## 2017-11-13 NOTE — Progress Notes (Signed)
  Echocardiogram 2D Echocardiogram has been performed.  Gerald Frey 11/13/2017, 3:38 PM

## 2017-11-13 NOTE — Telephone Encounter (Signed)
Patient given detailed instructions per Myocardial Perfusion Study Information Sheet for the test on 11/14/17. Patient notified to arrive 15 minutes early and that it is imperative to arrive on time for appointment to keep from having the test rescheduled.  If you need to cancel or reschedule your appointment, please call the office within 24 hours of your appointment. . Patient verbalized understanding. Kirstie Peri

## 2017-11-14 ENCOUNTER — Ambulatory Visit (HOSPITAL_COMMUNITY): Payer: Medicare Other | Attending: Internal Medicine

## 2017-11-14 DIAGNOSIS — R079 Chest pain, unspecified: Secondary | ICD-10-CM | POA: Diagnosis not present

## 2017-11-14 LAB — MYOCARDIAL PERFUSION IMAGING
CHL CUP RESTING HR STRESS: 67 {beats}/min
LHR: 0.41
LV dias vol: 137 mL (ref 62–150)
LVSYSVOL: 72 mL
NUC STRESS TID: 0.89
Peak HR: 83 {beats}/min
SDS: 2
SRS: 8
SSS: 10

## 2017-11-14 MED ORDER — TECHNETIUM TC 99M TETROFOSMIN IV KIT
10.9000 | PACK | Freq: Once | INTRAVENOUS | Status: AC | PRN
  Administered 2017-11-14: 10.9 via INTRAVENOUS
  Filled 2017-11-14: qty 11

## 2017-11-14 MED ORDER — TECHNETIUM TC 99M TETROFOSMIN IV KIT
30.9000 | PACK | Freq: Once | INTRAVENOUS | Status: AC | PRN
  Administered 2017-11-14: 30.9 via INTRAVENOUS
  Filled 2017-11-14: qty 31

## 2017-11-14 MED ORDER — REGADENOSON 0.4 MG/5ML IV SOLN
0.4000 mg | Freq: Once | INTRAVENOUS | Status: AC
  Administered 2017-11-14: 0.4 mg via INTRAVENOUS

## 2017-11-28 DIAGNOSIS — R3129 Other microscopic hematuria: Secondary | ICD-10-CM | POA: Diagnosis not present

## 2017-11-28 DIAGNOSIS — E7800 Pure hypercholesterolemia, unspecified: Secondary | ICD-10-CM | POA: Diagnosis not present

## 2017-12-04 ENCOUNTER — Ambulatory Visit (INDEPENDENT_AMBULATORY_CARE_PROVIDER_SITE_OTHER): Payer: Medicare Other | Admitting: Pharmacist Clinician (PhC)/ Clinical Pharmacy Specialist

## 2017-12-04 DIAGNOSIS — I4891 Unspecified atrial fibrillation: Secondary | ICD-10-CM | POA: Diagnosis not present

## 2017-12-04 DIAGNOSIS — Z7901 Long term (current) use of anticoagulants: Secondary | ICD-10-CM | POA: Diagnosis not present

## 2017-12-04 DIAGNOSIS — I482 Chronic atrial fibrillation: Secondary | ICD-10-CM

## 2017-12-04 DIAGNOSIS — I4821 Permanent atrial fibrillation: Secondary | ICD-10-CM

## 2017-12-04 LAB — POCT INR: INR: 2.5 (ref 2.0–3.0)

## 2018-01-02 ENCOUNTER — Encounter: Payer: Self-pay | Admitting: Internal Medicine

## 2018-01-02 ENCOUNTER — Ambulatory Visit: Payer: Medicare Other | Admitting: Internal Medicine

## 2018-01-02 ENCOUNTER — Ambulatory Visit (INDEPENDENT_AMBULATORY_CARE_PROVIDER_SITE_OTHER): Payer: Medicare Other | Admitting: Pharmacist

## 2018-01-02 VITALS — BP 114/72 | HR 67 | Ht 75.0 in | Wt 280.4 lb

## 2018-01-02 DIAGNOSIS — Z7901 Long term (current) use of anticoagulants: Secondary | ICD-10-CM

## 2018-01-02 DIAGNOSIS — I481 Persistent atrial fibrillation: Secondary | ICD-10-CM | POA: Diagnosis not present

## 2018-01-02 DIAGNOSIS — E785 Hyperlipidemia, unspecified: Secondary | ICD-10-CM

## 2018-01-02 DIAGNOSIS — I4819 Other persistent atrial fibrillation: Secondary | ICD-10-CM

## 2018-01-02 DIAGNOSIS — I255 Ischemic cardiomyopathy: Secondary | ICD-10-CM | POA: Diagnosis not present

## 2018-01-02 DIAGNOSIS — I4891 Unspecified atrial fibrillation: Secondary | ICD-10-CM

## 2018-01-02 DIAGNOSIS — R079 Chest pain, unspecified: Secondary | ICD-10-CM

## 2018-01-02 LAB — POCT INR: INR: 2.2 (ref 2.0–3.0)

## 2018-01-02 NOTE — Progress Notes (Signed)
[name] Kitchen          OFFICE NOTE  Chief Complaint:  Follow-up stress test  Primary Care Physician: Deland Pretty, MD  HPI:  Gerald Frey is a 72 year old gentleman who I am following for history of chronic A-fib on Coumadin, hypertension, dyslipidemia. Overall, again doing fairly well without any new concerns today. He denies any shortness of breath, palpitations, presyncope, or syncopal symptoms. INR in the office today was 3.0 which is stable. This is a history of coronary disease and had a stent to the LAD in 2005.  He has no anginal complaints. Recently he has missed some of his medications and he can definitely tell he feels worse, probably because of RVR.  However, he has been chopping wood without difficulty.  I saw Gerald Frey back in the office today. He is again without complaints. He denies any chest pain or worsening shortness of breath. He remains in chronic atrial fibrillation with an INR of 2.8 in the office. He's been very stable we discussed possibly changing to a direct oral anticoagulant before, but he prefers to stay on warfarin.  04/01/2016  Gerald Frey returns today for follow-up. He reports feeling well, although has gained weight over the last year. He denies any chest pain or worsening shortness of breath. His INR checked today was 2.9. It's been very stable. We again discussed possibly switching to a novel oral anticoagulant, however he prefers to stay on warfarin. Since he's been very well controlled, it's difficult to argue that there would be additional benefit from a NOAC. He's not had any significant bleeding complications. His last LV assessment was in 2009 when EF was 45-50% at that time. He is overdue for cholesterol testing as well.  04/03/2017  Gerald Frey was seen today in follow-up.  Is been a year since we last saw him.  He has been working on weight loss and is 1 pound lighter than we saw him last year which was lower than his previous weight.  He  denies any chest pain or worsening shortness of breath.  Blood pressure is well controlled today.  He is in long-standing persistent if not permanent atrial fibrillation.  He wishes to remain on warfarin and has had very stable INRs.  Ventricular rate is well controlled at 79 today.  LVEF normalized on his last echo in January 2018.  01/02/2018  Gerald Frey returns today for follow-up.  Recently he was seen in the A. fib clinic by Roderic Palau.  He was having symptoms concerning for chest pain.  Subsequently underwent an echocardiogram and stress test.  The stress test was negative for ischemia however showed mildly reduced LV function, but this was likely gating artifact as his echo showed normal systolic function.  He did report that he had some dizziness and felt somewhat dehydrated after working out in the yard.  He wondered if this might of provoked his A. fib.  This did improve with cooling down and drinking some Powerade.  He says since that episode a few months ago he is done very well and has had no further episodes.  He does get occasionally dizzy when he bends over to pick things up and stands up too quickly.  Of note he is on tamsulosin as well.  Most recent lipid profile showed LDL near goal at 71.  PMHx:  Past Medical History:  Diagnosis Date  . Atrial fibrillation ([place]) 06/19/2007   2D Echo EF=45-50%  . CAD (coronary artery disease) 2005  Stents.  . Dyslipidemia   . Hypertension   . Palpitation     Past Surgical History:  Procedure Laterality Date  . APPENDECTOMY  01/2000  . CARDIAC CATHETERIZATION  03/18/2004   CYPHER 3.5x8mm stent in the proximal LAD  . COLONOSCOPY  07/2004   hx colon polyps  . KNEE CARTILAGE SURGERY  11/2002    FAMHx:  Family History  Problem Relation Age of Onset  . Emphysema Mother 43  . Heart failure Father   . Alzheimer's disease Father 69    SOCHx:   reports that he has never smoked. He has never used smokeless tobacco. He reports that  he does not drink alcohol or use drugs.  ALLERGIES:  No Known Allergies  ROS: Pertinent items noted in HPI and remainder of comprehensive ROS otherwise negative.  HOME MEDS: Current Outpatient Medications  Medication Sig Dispense Refill  . aspirin 81 MG tablet Take 81 mg by mouth daily.    . cetirizine (ZYRTEC) 10 MG tablet Take 10 mg by mouth as needed.     . diltiazem (CARDIZEM CD) 120 MG 24 hr capsule TAKE ONE CAPSULE BY MOUTH EVERY DAY 30 capsule 11  . fish oil-omega-3 fatty acids 1000 MG capsule Take 1 g by mouth 2 (two) times daily.     . metoprolol tartrate (LOPRESSOR) 50 MG tablet Take 1 tablet (50 mg total) by mouth 2 (two) times daily. 180 tablet 3  . rosuvastatin (CRESTOR) 20 MG tablet TAKE 1 TABLET EVERY DAY 30 tablet 10  . tamsulosin (FLOMAX) 0.4 MG CAPS capsule Take 0.4 mg by mouth daily after supper.    . warfarin (COUMADIN) 5 MG tablet TAKE 1 TABLET (5 MG TOTAL) BY MOUTH DAILY. OR AS DIRECTED BY COUMADIN CLINIC 90 tablet 1   No current facility-administered medications for this visit.     LABS/IMAGING: No results found for this or any previous visit (from the past 48 hour(s)). No results found.  VITALS: BP 114/72   Pulse 67   Ht 6\' 3"  (1.905 m)   Wt 280 lb 6.4 oz (127.2 kg)   BMI 35.05 kg/m   EXAM: General appearance: alert and no distress Neck: no adenopathy, no carotid bruit, no JVD, supple, symmetrical, trachea midline and thyroid not enlarged, symmetric, no tenderness/mass/nodules Lungs: clear to auscultation bilaterally Heart: regular rate and rhythm, S1, S2 normal, no murmur, click, rub or gallop Abdomen: soft, non-tender; bowel sounds normal; no masses,  no organomegaly Extremities: extremities normal, atraumatic, no cyanosis or edema Pulses: 2+ and symmetric Skin: Skin color, texture, turgor normal. No rashes or lesions Neurologic: Grossly normal  EKG: A. fib at 67, nonspecific ST and T wave changes-personally reviewed  ASSESSMENT: 1. Coronary  artery disease status post PCI of the LAD 2005 (low risk myoview 10/2017) 2. Permanent atrial fibrillation on warfarin - CHADSVASC 2 (HTN, CHF) on warfarin 3. History of ischemic cardiomyopathy, EF 45-50% (2009), up to 55-60% (2019) 4. Hypertension 5. Dyslipidemia  PLAN: 1.   Gerald Frey had a low risk stress test and echo which showed normal systolic function.  This was in response to chest discomfort and what was probably dehydration.  He has not had any more issues with atrial fibrillation and only gets mild positional dizziness when bending over at the waist.  No further changes were made to his medications.  Plan otherwise follow-up annually or sooner as necessary.  Pixie Casino, MD, [facility], [place] Director of the Advanced  Lipid Disorders &  Cardiovascular Risk Reduction Clinic Attending Cardiologist  Direct Dial: [phone]  Fax: [phone]  Website:  [email or web address] 01/02/2018, 4:04 PM

## 2018-01-02 NOTE — Patient Instructions (Addendum)
You can take extra half tablet of metoprolol tartrate for tachycardia per Dr. Debara Pickett  Your physician wants you to follow-up in: 1 year with Dr. Debara Pickett. You will receive a reminder letter in the mail two months in advance. If you don't receive a letter, please call our office to schedule the follow-up appointment.

## 2018-01-13 ENCOUNTER — Other Ambulatory Visit: Payer: Self-pay | Admitting: Internal Medicine

## 2018-01-25 ENCOUNTER — Other Ambulatory Visit: Payer: Self-pay | Admitting: Internal Medicine

## 2018-02-14 ENCOUNTER — Ambulatory Visit (INDEPENDENT_AMBULATORY_CARE_PROVIDER_SITE_OTHER): Payer: Medicare Other | Admitting: Pharmacist Clinician (PhC)/ Clinical Pharmacy Specialist

## 2018-02-14 DIAGNOSIS — I4821 Permanent atrial fibrillation: Secondary | ICD-10-CM

## 2018-02-14 DIAGNOSIS — Z7901 Long term (current) use of anticoagulants: Secondary | ICD-10-CM

## 2018-02-14 DIAGNOSIS — I4891 Unspecified atrial fibrillation: Secondary | ICD-10-CM | POA: Diagnosis not present

## 2018-02-14 LAB — POCT INR: INR: 2.4 (ref 2.0–3.0)

## 2018-03-24 ENCOUNTER — Other Ambulatory Visit: Payer: Self-pay | Admitting: Internal Medicine

## 2018-03-28 ENCOUNTER — Ambulatory Visit (INDEPENDENT_AMBULATORY_CARE_PROVIDER_SITE_OTHER): Payer: Medicare Other | Admitting: Pharmacist Clinician (PhC)/ Clinical Pharmacy Specialist

## 2018-03-28 ENCOUNTER — Other Ambulatory Visit: Payer: Self-pay | Admitting: Internal Medicine

## 2018-03-28 DIAGNOSIS — Z7901 Long term (current) use of anticoagulants: Secondary | ICD-10-CM

## 2018-03-28 DIAGNOSIS — I4891 Unspecified atrial fibrillation: Secondary | ICD-10-CM

## 2018-03-28 LAB — POCT INR: INR: 2.7 (ref 2.0–3.0)

## 2018-05-09 ENCOUNTER — Ambulatory Visit (INDEPENDENT_AMBULATORY_CARE_PROVIDER_SITE_OTHER): Payer: Medicare Other | Admitting: Pharmacist

## 2018-05-09 DIAGNOSIS — Z7901 Long term (current) use of anticoagulants: Secondary | ICD-10-CM | POA: Diagnosis not present

## 2018-05-09 DIAGNOSIS — I4891 Unspecified atrial fibrillation: Secondary | ICD-10-CM | POA: Diagnosis not present

## 2018-05-09 LAB — POCT INR: INR: 1.8 — AB (ref 2.0–3.0)

## 2018-06-06 ENCOUNTER — Ambulatory Visit (INDEPENDENT_AMBULATORY_CARE_PROVIDER_SITE_OTHER): Payer: Medicare Other | Admitting: Pharmacist

## 2018-06-06 DIAGNOSIS — I4891 Unspecified atrial fibrillation: Secondary | ICD-10-CM

## 2018-06-06 DIAGNOSIS — Z7901 Long term (current) use of anticoagulants: Secondary | ICD-10-CM | POA: Diagnosis not present

## 2018-06-06 LAB — POCT INR: INR: 3.4 — AB (ref 2.0–3.0)

## 2018-07-04 ENCOUNTER — Ambulatory Visit (INDEPENDENT_AMBULATORY_CARE_PROVIDER_SITE_OTHER): Payer: Medicare Other | Admitting: Pharmacist Clinician (PhC)/ Clinical Pharmacy Specialist

## 2018-07-04 DIAGNOSIS — I4891 Unspecified atrial fibrillation: Secondary | ICD-10-CM

## 2018-07-04 DIAGNOSIS — Z7901 Long term (current) use of anticoagulants: Secondary | ICD-10-CM | POA: Diagnosis not present

## 2018-07-04 LAB — POCT INR: INR: 2.5 (ref 2.0–3.0)

## 2018-07-30 ENCOUNTER — Other Ambulatory Visit: Payer: Self-pay | Admitting: Internal Medicine

## 2018-08-14 ENCOUNTER — Telehealth: Payer: Self-pay

## 2018-08-14 NOTE — Telephone Encounter (Signed)
lmom for prescreen  

## 2018-08-15 ENCOUNTER — Ambulatory Visit (INDEPENDENT_AMBULATORY_CARE_PROVIDER_SITE_OTHER): Payer: Medicare Other | Admitting: Pharmacist

## 2018-08-15 ENCOUNTER — Other Ambulatory Visit: Payer: Self-pay

## 2018-08-15 DIAGNOSIS — Z7901 Long term (current) use of anticoagulants: Secondary | ICD-10-CM

## 2018-08-15 LAB — POCT INR: INR: 3.2 — AB (ref 2.0–3.0)

## 2018-09-19 ENCOUNTER — Telehealth: Payer: Self-pay

## 2018-09-19 NOTE — Telephone Encounter (Signed)
LMOM TO MOVE APPT BACK TO NL IN OFFICE AWARE

## 2018-09-20 NOTE — Telephone Encounter (Signed)

## 2018-09-26 ENCOUNTER — Ambulatory Visit (INDEPENDENT_AMBULATORY_CARE_PROVIDER_SITE_OTHER): Payer: Medicare Other | Admitting: Pharmacist Clinician (PhC)/ Clinical Pharmacy Specialist

## 2018-09-26 ENCOUNTER — Other Ambulatory Visit: Payer: Self-pay

## 2018-09-26 DIAGNOSIS — Z7901 Long term (current) use of anticoagulants: Secondary | ICD-10-CM

## 2018-09-26 DIAGNOSIS — I4891 Unspecified atrial fibrillation: Secondary | ICD-10-CM

## 2018-09-26 LAB — POCT INR: INR: 2.6 (ref 2.0–3.0)

## 2018-11-05 ENCOUNTER — Telehealth: Payer: Self-pay

## 2018-11-05 NOTE — Telephone Encounter (Signed)

## 2018-11-07 ENCOUNTER — Other Ambulatory Visit: Payer: Self-pay

## 2018-11-07 ENCOUNTER — Ambulatory Visit (INDEPENDENT_AMBULATORY_CARE_PROVIDER_SITE_OTHER): Payer: Medicare Other | Admitting: Pharmacist Clinician (PhC)/ Clinical Pharmacy Specialist

## 2018-11-07 DIAGNOSIS — Z7901 Long term (current) use of anticoagulants: Secondary | ICD-10-CM | POA: Diagnosis not present

## 2018-11-07 DIAGNOSIS — I4891 Unspecified atrial fibrillation: Secondary | ICD-10-CM

## 2018-11-07 LAB — POCT INR: INR: 1.9 — AB (ref 2.0–3.0)

## 2018-12-19 ENCOUNTER — Ambulatory Visit (INDEPENDENT_AMBULATORY_CARE_PROVIDER_SITE_OTHER): Payer: Medicare Other | Admitting: Pharmacist Clinician (PhC)/ Clinical Pharmacy Specialist

## 2018-12-19 ENCOUNTER — Other Ambulatory Visit: Payer: Self-pay

## 2018-12-19 DIAGNOSIS — Z7901 Long term (current) use of anticoagulants: Secondary | ICD-10-CM | POA: Diagnosis not present

## 2018-12-19 DIAGNOSIS — I4891 Unspecified atrial fibrillation: Secondary | ICD-10-CM

## 2018-12-19 LAB — POCT INR: INR: 3.7 — AB (ref 2.0–3.0)

## 2019-01-05 ENCOUNTER — Other Ambulatory Visit: Payer: Self-pay | Admitting: Internal Medicine

## 2019-01-19 ENCOUNTER — Other Ambulatory Visit: Payer: Self-pay | Admitting: Internal Medicine

## 2019-01-30 ENCOUNTER — Ambulatory Visit (INDEPENDENT_AMBULATORY_CARE_PROVIDER_SITE_OTHER): Payer: Medicare Other | Admitting: Pharmacist

## 2019-01-30 ENCOUNTER — Other Ambulatory Visit: Payer: Self-pay

## 2019-01-30 DIAGNOSIS — I4891 Unspecified atrial fibrillation: Secondary | ICD-10-CM | POA: Diagnosis not present

## 2019-01-30 DIAGNOSIS — Z7901 Long term (current) use of anticoagulants: Secondary | ICD-10-CM | POA: Diagnosis not present

## 2019-01-30 LAB — POCT INR: INR: 2.7 (ref 2.0–3.0)

## 2019-03-13 ENCOUNTER — Other Ambulatory Visit: Payer: Self-pay

## 2019-03-13 ENCOUNTER — Ambulatory Visit (INDEPENDENT_AMBULATORY_CARE_PROVIDER_SITE_OTHER): Payer: Medicare Other | Admitting: Pharmacist Clinician (PhC)/ Clinical Pharmacy Specialist

## 2019-03-13 DIAGNOSIS — Z7901 Long term (current) use of anticoagulants: Secondary | ICD-10-CM

## 2019-03-13 DIAGNOSIS — I4891 Unspecified atrial fibrillation: Secondary | ICD-10-CM

## 2019-03-13 LAB — POCT INR: INR: 3 (ref 2.0–3.0)

## 2019-04-05 ENCOUNTER — Other Ambulatory Visit: Payer: Self-pay | Admitting: Internal Medicine

## 2019-04-08 NOTE — Telephone Encounter (Signed)
Rx(s) sent to pharmacy electronically.  

## 2019-04-09 ENCOUNTER — Other Ambulatory Visit: Payer: Self-pay | Admitting: Internal Medicine

## 2019-04-17 ENCOUNTER — Other Ambulatory Visit: Payer: Self-pay | Admitting: Internal Medicine

## 2019-04-17 NOTE — Telephone Encounter (Signed)
Rx(s) sent to pharmacy electronically.  

## 2019-04-24 ENCOUNTER — Ambulatory Visit (INDEPENDENT_AMBULATORY_CARE_PROVIDER_SITE_OTHER): Payer: Medicare PPO | Admitting: Pharmacist

## 2019-04-24 ENCOUNTER — Other Ambulatory Visit: Payer: Self-pay

## 2019-04-24 DIAGNOSIS — Z7901 Long term (current) use of anticoagulants: Secondary | ICD-10-CM

## 2019-04-24 DIAGNOSIS — I4891 Unspecified atrial fibrillation: Secondary | ICD-10-CM

## 2019-04-24 LAB — POCT INR: INR: 4.4 — AB (ref 2.0–3.0)

## 2019-04-30 DIAGNOSIS — Z20828 Contact with and (suspected) exposure to other viral communicable diseases: Secondary | ICD-10-CM | POA: Diagnosis not present

## 2019-05-15 ENCOUNTER — Ambulatory Visit (INDEPENDENT_AMBULATORY_CARE_PROVIDER_SITE_OTHER): Payer: Medicare PPO | Admitting: Pharmacist

## 2019-05-15 DIAGNOSIS — I4891 Unspecified atrial fibrillation: Secondary | ICD-10-CM | POA: Diagnosis not present

## 2019-05-15 DIAGNOSIS — Z7901 Long term (current) use of anticoagulants: Secondary | ICD-10-CM

## 2019-05-15 LAB — POCT INR: INR: 6.3 — AB (ref 2.0–3.0)

## 2019-05-15 LAB — PROTIME-INR
INR: 6 (ref 0.9–1.2)
Prothrombin Time: 63.4 s — ABNORMAL HIGH (ref 9.1–12.0)

## 2019-05-20 ENCOUNTER — Other Ambulatory Visit: Payer: Self-pay

## 2019-05-20 ENCOUNTER — Ambulatory Visit (INDEPENDENT_AMBULATORY_CARE_PROVIDER_SITE_OTHER): Payer: Medicare PPO | Admitting: Pharmacist

## 2019-05-20 DIAGNOSIS — I4891 Unspecified atrial fibrillation: Secondary | ICD-10-CM

## 2019-05-20 DIAGNOSIS — Z7901 Long term (current) use of anticoagulants: Secondary | ICD-10-CM | POA: Diagnosis not present

## 2019-05-20 LAB — POCT INR: INR: 1.4 — AB (ref 2.0–3.0)

## 2019-06-05 DIAGNOSIS — E7800 Pure hypercholesterolemia, unspecified: Secondary | ICD-10-CM | POA: Diagnosis not present

## 2019-06-17 ENCOUNTER — Other Ambulatory Visit: Payer: Self-pay

## 2019-06-17 ENCOUNTER — Ambulatory Visit (INDEPENDENT_AMBULATORY_CARE_PROVIDER_SITE_OTHER): Payer: Medicare PPO | Admitting: Pharmacist

## 2019-06-17 DIAGNOSIS — I4891 Unspecified atrial fibrillation: Secondary | ICD-10-CM | POA: Diagnosis not present

## 2019-06-17 DIAGNOSIS — Z7901 Long term (current) use of anticoagulants: Secondary | ICD-10-CM

## 2019-06-17 LAB — POCT INR: INR: 2.6 (ref 2.0–3.0)

## 2019-06-28 ENCOUNTER — Other Ambulatory Visit: Payer: Self-pay | Admitting: Internal Medicine

## 2019-06-30 ENCOUNTER — Other Ambulatory Visit: Payer: Self-pay | Admitting: Internal Medicine

## 2019-07-10 ENCOUNTER — Telehealth: Payer: Self-pay | Admitting: Internal Medicine

## 2019-07-10 NOTE — Telephone Encounter (Signed)
Went in pt's chart to see office note from his last visit. I wanted to see when he needs his next appt.

## 2019-07-12 ENCOUNTER — Encounter: Payer: Self-pay | Admitting: Physician Assistant

## 2019-07-12 ENCOUNTER — Other Ambulatory Visit: Payer: Self-pay

## 2019-07-12 ENCOUNTER — Ambulatory Visit: Payer: Medicare PPO | Admitting: Physician Assistant

## 2019-07-12 VITALS — BP 132/74 | HR 70 | Temp 97.0°F | Ht 75.0 in | Wt 279.0 lb

## 2019-07-12 DIAGNOSIS — E785 Hyperlipidemia, unspecified: Secondary | ICD-10-CM

## 2019-07-12 DIAGNOSIS — I10 Essential (primary) hypertension: Secondary | ICD-10-CM

## 2019-07-12 DIAGNOSIS — I4821 Permanent atrial fibrillation: Secondary | ICD-10-CM | POA: Diagnosis not present

## 2019-07-12 DIAGNOSIS — I2510 Atherosclerotic heart disease of native coronary artery without angina pectoris: Secondary | ICD-10-CM

## 2019-07-12 MED ORDER — METOPROLOL TARTRATE 50 MG PO TABS: 50.0000 mg | ORAL_TABLET | Freq: Two times a day (BID) | ORAL | 3 refills | Status: DC

## 2019-07-12 NOTE — Patient Instructions (Signed)
Medication Instructions:  Your physician recommends that you continue on your current medications as directed. Please refer to the Current Medication list given to you today.  *If you need a refill on your cardiac medications before your next appointment, please call your pharmacy*  Lab Work: NONE ordered at this time of appointment   If you have labs (blood work) drawn today and your tests are completely normal, you will receive your results only by: MyChart Message (if you have MyChart) OR A paper copy in the mail If you have any lab test that is abnormal or we need to change your treatment, we will call you to review the results.  Testing/Procedures: NONE ordered at this time of appointment   Follow-Up: At [facility], you and your health needs are our priority.  As part of our continuing mission to provide you with exceptional heart care, we have created designated Provider Care Teams.  These Care Teams include your primary Cardiologist (physician) and Advanced Practice Providers (APPs -  Physician Assistants and Nurse Practitioners) who all work together to provide you with the care you need, when you need it.  We recommend signing up for the patient portal called "MyChart".  Sign up information is provided on this After Visit Summary.  MyChart is used to connect with patients for Virtual Visits (Telemedicine).  Patients are able to view lab/test results, encounter notes, upcoming appointments, etc.  Non-urgent messages can be sent to your provider as well.   To learn more about what you can do with MyChart, go to [email or web address].    Your next appointment:   1 year(s)  The format for your next appointment:   In Person  Provider:   K. Chad Hilty, MD  Other Instructions   

## 2019-07-12 NOTE — Progress Notes (Signed)
Cardiology Office Note:    Date:  07/14/2019   ID:  Gerald Frey, DOB 06-Jul-1945, MRN [id number]  PCP:  Deland Pretty, MD  Cardiologist:  Pixie Casino, MD  Electrophysiologist:  None   Referring MD: Deland Pretty, MD   Chief Complaint  Patient presents with  . Follow-up    annual visit    History of Present Illness:    Gerald Frey is a 74 y.o. male with a hx of CAD, hypertension, hyperlipidemia and permanent atrial fibrillation on Coumadin.  He had a stent placed in LAD in 2005.  He has been in atrial fibrillation since 2013.  Last echocardiogram obtained on 11/13/2017 showed EF 55 to 60%, mildly dilated ascending aorta, mild LAE.  Myoview obtained on 11/14/2017 showed EF 48%, no reversible ischemia.  Patient presents today for cardiology office visit.  Unfortunately both him and his wife had the Covid infection in January.  They had loss of  appetite symptom.  He did receive the first dose of Pfizer vaccine.  Otherwise he denies any chest pain or shortness of breath.  He does occasionally notice worsening lower extremity edema when he is on his feet all day.  This gets better after overnight rest.  I recommend leg elevation as a treatment for this.  I do not think he necessarily need diuretic pills.  He is last lipid panel was performed by Dr. Shelia Media in August 2020 at that time, total cholesterol was very well controlled, HDL was borderline low at 38, and triglyceride was borderline elevated.  His cholesterol has been controlled on the Crestor and fish oil.  I recommended continue on his current therapy and have repeat lab work by his PCP.  Otherwise since he is doing very well from the cardiac perspective, he can follow-up in 1 year.   Past Medical History:  Diagnosis Date  . Atrial fibrillation ([place]) 06/19/2007   2D Echo EF=45-50%  . CAD (coronary artery disease) 2005   Stents.  . Dyslipidemia   . Hypertension   . Palpitation     Past Surgical History:   Procedure Laterality Date  . APPENDECTOMY  01/2000  . CARDIAC CATHETERIZATION  03/18/2004   CYPHER 3.5x17mm stent in the proximal LAD  . COLONOSCOPY  07/2004   hx colon polyps  . KNEE CARTILAGE SURGERY  11/2002    Current Medications: Current Meds  Medication Sig  . aspirin 81 MG tablet Take 81 mg by mouth daily.  . cetirizine (ZYRTEC) 10 MG tablet Take 10 mg by mouth as needed.   . diltiazem (CARDIZEM CD) 120 MG 24 hr capsule TAKE 1 CAPSULE BY MOUTH EVERY DAY  . fish oil-omega-3 fatty acids 1000 MG capsule Take 1 g by mouth 2 (two) times daily.   . metoprolol tartrate (LOPRESSOR) 50 MG tablet Take 1 tablet (50 mg total) by mouth 2 (two) times daily.  . rosuvastatin (CRESTOR) 20 MG tablet TAKE 1 TABLET BY MOUTH EVERY DAY  . tamsulosin (FLOMAX) 0.4 MG CAPS capsule Take 0.4 mg by mouth daily after supper.  . warfarin (COUMADIN) 5 MG tablet TAKE 1 TABLET BY MOUTH EVERY DAY OR AS DIRECTED BY COUMADIN CLINIC  . [DISCONTINUED] metoprolol tartrate (LOPRESSOR) 50 MG tablet Take 1 tablet (50 mg total) by mouth 2 (two) times daily. NEEDS APPOINTMENT FOR FUTURE REFILLS     Allergies:   Patient has no known allergies.   Social History   Socioeconomic History  . Marital status: Married    Spouse name:  Not on file  . Number of children: 1  . Years of education: college  . Highest education level: Not on file  Occupational History  . Occupation: retired  Tobacco Use  . Smoking status: Never Smoker  . Smokeless tobacco: Never Used  Substance and Sexual Activity  . Alcohol use: No  . Drug use: No  . Sexual activity: Not on file  Other Topics Concern  . Not on file  Social History Narrative   Lives with wife in a one story home.  Has one son.  Retired Curator for [employer].     Education: college.    Social Determinants of Health   Financial Resource Strain:   . Difficulty of Paying Living Expenses:   Food Insecurity:   . Worried About Charity fundraiser in the  Last Year:   . Arboriculturist in the Last Year:   Transportation Needs:   . Film/video editor (Medical):   [name] Kitchen Lack of Transportation (Non-Medical):   Physical Activity:   . Days of Exercise per Week:   . Minutes of Exercise per Session:   Stress:   . Feeling of Stress :   Social Connections:   . Frequency of Communication with Friends and Family:   . Frequency of Social Gatherings with Friends and Family:   . Attends Religious Services:   . Active Member of Clubs or Organizations:   . Attends Archivist Meetings:   [name] Kitchen Marital Status:      Family History: The patient's family history includes Alzheimer's disease (age of onset: 7) in his father; Emphysema (age of onset: 14) in his mother; Heart failure in his father.  ROS:   Please see the history of present illness.     All other systems reviewed and are negative.  EKGs/Labs/Other Studies Reviewed:    The following studies were reviewed today:  Echo 11/13/2017 LV EF: 55% -  60%  Study Conclusions   - Left ventricle: The cavity size was normal. There was mild focal  basal hypertrophy of the septum. Systolic function was normal.  The estimated ejection fraction was in the range of 55% to 60%.  Wall motion was normal; there were no regional wall motion  abnormalities.  - Ascending aorta: The ascending aorta was mildly dilated.  - Left atrium: The atrium was mildly dilated.   Impressions:   - Normal LV systolic function; mildly dilated aortic root; mild  LAE; mild TR.     Myoview 11/14/2017  Nuclear stress EF: 48%.  No T wave inversion was noted during stress.  There was no ST segment deviation noted during stress.  This is an intermediate risk study.   Patchy tracer uptake, but no reversible ischemia in a coronary artery distribution. LVEF 48% with mild global hypokinesis. This is an intermediate risk study. Compared to a prior study in 2009, the LVEF is lower.  EKG:  EKG is ordered  today.  The ekg ordered today demonstrates atrial fibrillation without significant ST-T wave changes  Recent Labs: No results found for requested labs within last 8760 hours.  Recent Lipid Panel    Component Value Date/Time   CHOL 137 04/06/2017 0832   TRIG 150 (H) 04/06/2017 0832   HDL 39 (L) 04/06/2017 0832   CHOLHDL 3.5 04/06/2017 0832   CHOLHDL 4.1 04/05/2016 0844   VLDL 30 04/05/2016 0844   LDLCALC 68 04/06/2017 0832    Physical Exam:    VS:  BP 132/74  Pulse 70   Temp (!) 97 F (36.1 C) Comment: Forehead  Ht 6\' 3"  (1.905 m)   Wt 279 lb (126.6 kg)   SpO2 97%   BMI 34.87 kg/m     Wt Readings from Last 3 Encounters:  07/12/19 279 lb (126.6 kg)  01/02/18 280 lb 6.4 oz (127.2 kg)  11/14/17 276 lb (125.2 kg)     GEN:  Well nourished, well developed in no acute distress HEENT: Normal NECK: No JVD; No carotid bruits LYMPHATICS: No lymphadenopathy CARDIAC: Irregularly irregular, no murmurs, rubs, gallops RESPIRATORY:  Clear to auscultation without rales, wheezing or rhonchi  ABDOMEN: Soft, non-tender, non-distended MUSCULOSKELETAL:  No edema; No deformity  SKIN: Warm and dry NEUROLOGIC:  Alert and oriented x 3 PSYCHIATRIC:  Normal affect   ASSESSMENT:    1. Permanent atrial fibrillation ([place])   2. Coronary artery disease involving native coronary artery of native heart without angina pectoris   3. Essential hypertension   4. Hyperlipidemia LDL goal <70    PLAN:    In order of problems listed above:  1. Permanent atrial fibrillation: Rate controlled on current therapy.  Continue Coumadin, diltiazem and metoprolol  2. CAD: Last PCI was in 2005.  He has not had any chest pain since.  Continue aspirin and statin  3. Hypertension: Blood pressure well controlled  4. Hyperlipidemia: On fish oil and Crestor.  Last triglyceride was elevated, I recommended continuing the current therapy, diet and exercise.   Medication Adjustments/Labs and Tests Ordered: Current  medicines are reviewed at length with the patient today.  Concerns regarding medicines are outlined above.  Orders Placed This Encounter  Procedures  . EKG 12-Lead   Meds ordered this encounter  Medications  . metoprolol tartrate (LOPRESSOR) 50 MG tablet    Sig: Take 1 tablet (50 mg total) by mouth 2 (two) times daily.    Dispense:  180 tablet    Refill:  3    Patient Instructions  Medication Instructions:  Your physician recommends that you continue on your current medications as directed. Please refer to the Current Medication list given to you today.  *If you need a refill on your cardiac medications before your next appointment, please call your pharmacy*  Lab Work: NONE ordered at this time of appointment   If you have labs (blood work) drawn today and your tests are completely normal, you will receive your results only by: [name] Kitchen MyChart Message (if you have MyChart) OR . A paper copy in the mail If you have any lab test that is abnormal or we need to change your treatment, we will call you to review the results.  Testing/Procedures: NONE ordered at this time of appointment   Follow-Up: At [facility], you and your health needs are our priority.  As part of our continuing mission to provide you with exceptional heart care, we have created designated Provider Care Teams.  These Care Teams include your primary Cardiologist (physician) and Advanced Practice Providers (APPs -  Physician Assistants and Nurse Practitioners) who all work together to provide you with the care you need, when you need it.  We recommend signing up for the patient portal called "MyChart".  Sign up information is provided on this After Visit Summary.  MyChart is used to connect with patients for Virtual Visits (Telemedicine).  Patients are able to view lab/test results, encounter notes, upcoming appointments, etc.  Non-urgent messages can be sent to your provider as well.   To learn more about  what you can  do with MyChart, go to [email or web address].    Your next appointment:   1 year(s)  The format for your next appointment:   In Person  Provider:   Raliegh Ip Mali Hilty, MD  Other Instructions      Signed, [name], [place]  07/14/2019 11:25 PM    [place]

## 2019-07-14 ENCOUNTER — Encounter: Payer: Self-pay | Admitting: Physician Assistant

## 2019-07-15 ENCOUNTER — Other Ambulatory Visit: Payer: Self-pay | Admitting: Internal Medicine

## 2019-07-29 ENCOUNTER — Ambulatory Visit (INDEPENDENT_AMBULATORY_CARE_PROVIDER_SITE_OTHER): Payer: Medicare PPO | Admitting: Pharmacist

## 2019-07-29 ENCOUNTER — Other Ambulatory Visit: Payer: Self-pay

## 2019-07-29 DIAGNOSIS — Z7901 Long term (current) use of anticoagulants: Secondary | ICD-10-CM

## 2019-07-29 DIAGNOSIS — I4891 Unspecified atrial fibrillation: Secondary | ICD-10-CM

## 2019-07-29 LAB — POCT INR: INR: 2.1 (ref 2.0–3.0)

## 2019-09-09 ENCOUNTER — Other Ambulatory Visit: Payer: Self-pay

## 2019-09-09 ENCOUNTER — Ambulatory Visit (INDEPENDENT_AMBULATORY_CARE_PROVIDER_SITE_OTHER): Payer: Medicare PPO | Admitting: Pharmacist

## 2019-09-09 DIAGNOSIS — Z7901 Long term (current) use of anticoagulants: Secondary | ICD-10-CM | POA: Diagnosis not present

## 2019-09-09 DIAGNOSIS — I4891 Unspecified atrial fibrillation: Secondary | ICD-10-CM | POA: Diagnosis not present

## 2019-09-09 LAB — POCT INR: INR: 2.1 (ref 2.0–3.0)

## 2019-10-23 ENCOUNTER — Ambulatory Visit (INDEPENDENT_AMBULATORY_CARE_PROVIDER_SITE_OTHER): Payer: Medicare PPO

## 2019-10-23 ENCOUNTER — Other Ambulatory Visit: Payer: Self-pay

## 2019-10-23 DIAGNOSIS — I4891 Unspecified atrial fibrillation: Secondary | ICD-10-CM

## 2019-10-23 DIAGNOSIS — Z7901 Long term (current) use of anticoagulants: Secondary | ICD-10-CM | POA: Diagnosis not present

## 2019-10-23 LAB — POCT INR: INR: 2.3 (ref 2.0–3.0)

## 2019-10-23 NOTE — Patient Instructions (Signed)
Continue taking warfarin 5mg daily and repeat INR in 6 weeks  

## 2019-12-03 DIAGNOSIS — E7800 Pure hypercholesterolemia, unspecified: Secondary | ICD-10-CM | POA: Diagnosis not present

## 2019-12-03 DIAGNOSIS — Z125 Encounter for screening for malignant neoplasm of prostate: Secondary | ICD-10-CM | POA: Diagnosis not present

## 2019-12-04 ENCOUNTER — Ambulatory Visit (INDEPENDENT_AMBULATORY_CARE_PROVIDER_SITE_OTHER): Payer: Medicare PPO

## 2019-12-04 ENCOUNTER — Other Ambulatory Visit: Payer: Self-pay

## 2019-12-04 DIAGNOSIS — Z5181 Encounter for therapeutic drug level monitoring: Secondary | ICD-10-CM

## 2019-12-04 DIAGNOSIS — I4891 Unspecified atrial fibrillation: Secondary | ICD-10-CM | POA: Diagnosis not present

## 2019-12-04 DIAGNOSIS — Z7901 Long term (current) use of anticoagulants: Secondary | ICD-10-CM | POA: Diagnosis not present

## 2019-12-04 LAB — POCT INR: INR: 2.4 (ref 2.0–3.0)

## 2019-12-04 NOTE — Patient Instructions (Signed)
Continue taking warfarin 5mg  daily and repeat INR in 6 weeks

## 2019-12-10 DIAGNOSIS — E669 Obesity, unspecified: Secondary | ICD-10-CM | POA: Diagnosis not present

## 2019-12-10 DIAGNOSIS — Z0000 Encounter for general adult medical examination without abnormal findings: Secondary | ICD-10-CM | POA: Diagnosis not present

## 2019-12-10 DIAGNOSIS — E7800 Pure hypercholesterolemia, unspecified: Secondary | ICD-10-CM | POA: Diagnosis not present

## 2019-12-10 DIAGNOSIS — R739 Hyperglycemia, unspecified: Secondary | ICD-10-CM | POA: Diagnosis not present

## 2019-12-10 DIAGNOSIS — I2510 Atherosclerotic heart disease of native coronary artery without angina pectoris: Secondary | ICD-10-CM | POA: Diagnosis not present

## 2019-12-10 DIAGNOSIS — I10 Essential (primary) hypertension: Secondary | ICD-10-CM | POA: Diagnosis not present

## 2019-12-17 ENCOUNTER — Other Ambulatory Visit: Payer: Self-pay | Admitting: Internal Medicine

## 2020-01-05 ENCOUNTER — Other Ambulatory Visit: Payer: Self-pay | Admitting: Internal Medicine

## 2020-01-15 ENCOUNTER — Other Ambulatory Visit: Payer: Self-pay

## 2020-01-15 ENCOUNTER — Ambulatory Visit (INDEPENDENT_AMBULATORY_CARE_PROVIDER_SITE_OTHER): Payer: Medicare PPO

## 2020-01-15 DIAGNOSIS — Z7901 Long term (current) use of anticoagulants: Secondary | ICD-10-CM

## 2020-01-15 DIAGNOSIS — I4891 Unspecified atrial fibrillation: Secondary | ICD-10-CM

## 2020-01-15 DIAGNOSIS — Z5181 Encounter for therapeutic drug level monitoring: Secondary | ICD-10-CM

## 2020-01-15 LAB — POCT INR: INR: 2.8 (ref 2.0–3.0)

## 2020-01-15 NOTE — Patient Instructions (Signed)
Continue taking warfarin 5mg  daily and repeat INR in 6 weeks

## 2020-02-04 DIAGNOSIS — D2222 Melanocytic nevi of left ear and external auricular canal: Secondary | ICD-10-CM | POA: Diagnosis not present

## 2020-02-04 DIAGNOSIS — D2239 Melanocytic nevi of other parts of face: Secondary | ICD-10-CM | POA: Diagnosis not present

## 2020-02-04 DIAGNOSIS — L578 Other skin changes due to chronic exposure to nonionizing radiation: Secondary | ICD-10-CM | POA: Diagnosis not present

## 2020-02-04 DIAGNOSIS — Z85828 Personal history of other malignant neoplasm of skin: Secondary | ICD-10-CM | POA: Diagnosis not present

## 2020-02-04 DIAGNOSIS — Z86018 Personal history of other benign neoplasm: Secondary | ICD-10-CM | POA: Diagnosis not present

## 2020-02-04 DIAGNOSIS — D485 Neoplasm of uncertain behavior of skin: Secondary | ICD-10-CM | POA: Diagnosis not present

## 2020-02-04 DIAGNOSIS — L814 Other melanin hyperpigmentation: Secondary | ICD-10-CM | POA: Diagnosis not present

## 2020-02-04 DIAGNOSIS — L821 Other seborrheic keratosis: Secondary | ICD-10-CM | POA: Diagnosis not present

## 2020-02-04 DIAGNOSIS — D225 Melanocytic nevi of trunk: Secondary | ICD-10-CM | POA: Diagnosis not present

## 2020-02-26 ENCOUNTER — Other Ambulatory Visit: Payer: Self-pay

## 2020-02-26 ENCOUNTER — Ambulatory Visit (INDEPENDENT_AMBULATORY_CARE_PROVIDER_SITE_OTHER): Payer: Medicare PPO

## 2020-02-26 DIAGNOSIS — Z5181 Encounter for therapeutic drug level monitoring: Secondary | ICD-10-CM

## 2020-02-26 DIAGNOSIS — I4891 Unspecified atrial fibrillation: Secondary | ICD-10-CM

## 2020-02-26 DIAGNOSIS — Z7901 Long term (current) use of anticoagulants: Secondary | ICD-10-CM | POA: Diagnosis not present

## 2020-02-26 LAB — POCT INR: INR: 3.1 — AB (ref 2.0–3.0)

## 2020-02-26 NOTE — Patient Instructions (Signed)
Continue taking warfarin 5mg  daily and repeat INR in 6 weeks.  Eat greens over the next couple days

## 2020-03-21 ENCOUNTER — Encounter (HOSPITAL_COMMUNITY): Payer: Self-pay | Admitting: Emergency Medicine

## 2020-03-21 ENCOUNTER — Emergency Department (HOSPITAL_COMMUNITY)
Admission: EM | Admit: 2020-03-21 | Discharge: 2020-03-21 | Disposition: A | Discharge status: Home / Self Care | Payer: Medicare PPO | Attending: Emergency Medicine | Admitting: Emergency Medicine

## 2020-03-21 ENCOUNTER — Other Ambulatory Visit: Payer: Self-pay

## 2020-03-21 ENCOUNTER — Emergency Department (HOSPITAL_COMMUNITY): Payer: Medicare PPO

## 2020-03-21 DIAGNOSIS — Z7982 Long term (current) use of aspirin: Secondary | ICD-10-CM | POA: Diagnosis not present

## 2020-03-21 DIAGNOSIS — I10 Essential (primary) hypertension: Secondary | ICD-10-CM | POA: Insufficient documentation

## 2020-03-21 DIAGNOSIS — S82832A Other fracture of upper and lower end of left fibula, initial encounter for closed fracture: Secondary | ICD-10-CM | POA: Insufficient documentation

## 2020-03-21 DIAGNOSIS — M25572 Pain in left ankle and joints of left foot: Secondary | ICD-10-CM | POA: Insufficient documentation

## 2020-03-21 DIAGNOSIS — Z79899 Other long term (current) drug therapy: Secondary | ICD-10-CM | POA: Diagnosis not present

## 2020-03-21 DIAGNOSIS — S8992XA Unspecified injury of left lower leg, initial encounter: Secondary | ICD-10-CM | POA: Diagnosis present

## 2020-03-21 DIAGNOSIS — I2510 Atherosclerotic heart disease of native coronary artery without angina pectoris: Secondary | ICD-10-CM | POA: Diagnosis not present

## 2020-03-21 DIAGNOSIS — M25562 Pain in left knee: Secondary | ICD-10-CM | POA: Insufficient documentation

## 2020-03-21 DIAGNOSIS — Z951 Presence of aortocoronary bypass graft: Secondary | ICD-10-CM | POA: Insufficient documentation

## 2020-03-21 DIAGNOSIS — S82402A Unspecified fracture of shaft of left fibula, initial encounter for closed fracture: Secondary | ICD-10-CM | POA: Diagnosis not present

## 2020-03-21 DIAGNOSIS — W010XXA Fall on same level from slipping, tripping and stumbling without subsequent striking against object, initial encounter: Secondary | ICD-10-CM | POA: Insufficient documentation

## 2020-03-21 DIAGNOSIS — S82839A Other fracture of upper and lower end of unspecified fibula, initial encounter for closed fracture: Secondary | ICD-10-CM

## 2020-03-21 DIAGNOSIS — M1712 Unilateral primary osteoarthritis, left knee: Secondary | ICD-10-CM | POA: Diagnosis not present

## 2020-03-21 DIAGNOSIS — Z7901 Long term (current) use of anticoagulants: Secondary | ICD-10-CM | POA: Insufficient documentation

## 2020-03-21 MED ORDER — HYDROCODONE-ACETAMINOPHEN 5-325 MG PO TABS
1.0000 | ORAL_TABLET | Freq: Four times a day (QID) | ORAL | 0 refills | Status: DC | PRN
Start: 2020-03-21 — End: 2021-06-10

## 2020-03-21 NOTE — ED Triage Notes (Signed)
Pt states he tripped and fell last night.  Reports L knee pain and L ankle pain.  States he heard knee pop.

## 2020-03-21 NOTE — ED Provider Notes (Signed)
[facility]ERGENCY DEPARTMENT Provider Note   CSN: [id number] Arrival date & time: 03/21/20  1309     History Chief Complaint  Patient presents with  . Fall  . Knee Pain  . Ankle Pain    Gerald Frey is a 74 y.o. male.  Patient presents s/p trip and fall last PM. States his left foot got entangling in blanket, causing him to trip/stumble, and fall with left lower leg bent back under him. Pain to left knee since, constant, dull, moderate, worse w walking. Skin intact. No numbness/weakness to leg. Denies other pain or injury. No loc w fall, no faintness prior to fall. No headache since fall. No neck or back pain. No cp or sob. No abd pain or nv.  Remote hx arthroscopy.   The history is provided by the patient and the spouse.  Fall Pertinent negatives include no chest pain, no headaches and no shortness of breath.  Knee Pain Associated symptoms: no back pain, no fever and no neck pain   Ankle Pain Associated symptoms: no back pain, no fever and no neck pain        Past Medical History:  Diagnosis Date  . Atrial fibrillation ([place]) 06/19/2007   2D Echo EF=45-50%  . CAD (coronary artery disease) 2005   Stents.  . Dyslipidemia   . Hypertension   . Palpitation     Patient Active Problem List   Diagnosis Date Noted  . Chest pain 01/02/2018  . 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 Ischemic cardiomyopathy 04/01/2016  . Dyslipidemia 12/01/2012  . Coronary artery disease due to lipid rich plaque 12/01/2012  . Atrial fibrillation ([place]) 06/22/2012  . Long term current use of anticoagulant therapy 06/22/2012    Past Surgical History:  Procedure Laterality Date  . APPENDECTOMY  01/2000  . CARDIAC CATHETERIZATION  03/18/2004   CYPHER 3.5x53mm stent in the proximal LAD  . COLONOSCOPY  07/2004   hx colon polyps  . KNEE CARTILAGE SURGERY  11/2002       Family History  Problem Relation Age of Onset  . Emphysema Mother 64  . Heart  failure Father   . Alzheimer's disease Father 11    Social History   Tobacco Use  . Smoking status: Never Smoker  . Smokeless tobacco: Never Used  Substance Use Topics  . Alcohol use: No  . Drug use: No    Home Medications Prior to Admission medications   Medication Sig Start Date End Date Taking? Authorizing Provider  warfarin (COUMADIN) 5 MG tablet TAKE 1 TABLET BY MOUTH EVERY DAY OR AS DIRECTED BY COUMADIN CLINIC 01/06/20   Hilty, Nadean Corwin, MD  aspirin 81 MG tablet Take 81 mg by mouth daily.    [provider]  cetirizine (ZYRTEC) 10 MG tablet Take 10 mg by mouth as needed.     [provider]  diltiazem (CARDIZEM CD) 120 MG 24 hr capsule TAKE 1 CAPSULE BY MOUTH EVERY DAY 04/09/19   Hilty, Nadean Corwin, MD  fish oil-omega-3 fatty acids 1000 MG capsule Take 1 g by mouth 2 (two) times daily.     [provider]  metoprolol tartrate (LOPRESSOR) 50 MG tablet Take 1 tablet (50 mg total) by mouth 2 (two) times daily. 07/12/19   [name], PA  rosuvastatin (CRESTOR) 20 MG tablet TAKE 1 TABLET BY MOUTH EVERY DAY 12/17/19   Hilty, Nadean Corwin, MD  tamsulosin (FLOMAX) 0.4 MG CAPS capsule Take 0.4 mg by mouth daily  after supper.    [provider]    Allergies    Patient has no known allergies.  Review of Systems   Review of Systems  Constitutional: Negative for fever.  HENT: Negative for nosebleeds.   Eyes: Negative for redness.  Respiratory: Negative for shortness of breath.   Cardiovascular: Negative for chest pain.  Gastrointestinal: Negative for vomiting.  Genitourinary: Negative for flank pain.  Musculoskeletal: Negative for back pain and neck pain.  Skin: Negative for wound.  Neurological: Negative for numbness and headaches.  Hematological: Does not bruise/bleed easily.  Psychiatric/Behavioral: Negative for confusion.    Physical Exam Updated Vital Signs BP 111/69 (BP Location: Right Arm)   Pulse 69   Temp 98.1 F (36.7 C) (Oral)   Resp  15   SpO2 95%   Physical Exam Vitals and nursing note reviewed.  Constitutional:      Appearance: Normal appearance. He is well-developed.  HENT:     Head: Atraumatic.     Nose: Nose normal.     Mouth/Throat:     Mouth: Mucous membranes are moist.  Eyes:     General: No scleral icterus.    Conjunctiva/sclera: Conjunctivae normal.     Pupils: Pupils are equal, round, and reactive to light.  Neck:     Trachea: No tracheal deviation.  Cardiovascular:     Rate and Rhythm: Normal rate.     Pulses: Normal pulses.  Pulmonary:     Effort: Pulmonary effort is normal. No accessory muscle usage or respiratory distress.  Chest:     Chest wall: No tenderness.  Abdominal:     General: There is no distension.     Palpations: Abdomen is soft.     Tenderness: There is no abdominal tenderness.  Genitourinary:    Comments: No cva tenderness. Musculoskeletal:        General: No swelling.     Cervical back: Normal range of motion and neck supple. No rigidity.     Comments: CTLS spine, non tender, aligned, no step off. Tenderness left knee laterally, and proximal fibula. Skin is intact. +effusion. No pain w slow passive rom knee. No erythema or increased warmth to knee. Distal pulses palp. No other focal bony tenderness on LLE exam. No ankle or foot tenderness. No distal tib/fib tenderness. Compartment of LLE are soft, not tense.   Skin:    General: Skin is warm and dry.     Findings: No rash.  Neurological:     Mental Status: He is alert.     Comments: Alert, speech clear. LLE, motor/sens grossly intact.   Psychiatric:        Mood and Affect: Mood normal.     ED Results / Procedures / Treatments   Labs (all labs ordered are listed, but only abnormal results are displayed) Labs Reviewed - No data to display  EKG None  Radiology DG Ankle Complete Left  Result Date: 03/21/2020 CLINICAL DATA:  Recent fall with left ankle pain, initial encounter EXAM: LEFT ANKLE COMPLETE - 3+ VIEW  COMPARISON:  None. FINDINGS: Calcaneal spurring is noted. Mild tarsal degenerative changes are seen as well. No acute fracture or dislocation is noted. No soft tissue abnormality is seen. IMPRESSION: Degenerative change without acute abnormality. Electronically Signed   By: Inez Catalina M.D.   On: 03/21/2020 14:40   DG Knee Complete 4 Views Left  Result Date: 03/21/2020 CLINICAL DATA:  Left knee pain following fall, initial encounter EXAM: LEFT KNEE - COMPLETE 4+ VIEW  COMPARISON:  None. FINDINGS: Minimally displaced proximal fibular neck fracture is noted. No other fracture is seen. No soft tissue abnormality is noted. Degenerative changes of the knee joint are seen. IMPRESSION: Degenerative changes in the knee joint. Proximal fibular fracture. Electronically Signed   By: Inez Catalina M.D.   On: 03/21/2020 14:37    Procedures Procedures (including critical care time)  Medications Ordered in ED Medications - No data to display  ED Course  I have reviewed the triage vital signs and the nursing notes.  Pertinent labs & imaging results that were available during my care of the patient were reviewed by me and considered in my medical decision making (see chart for details).    MDM Rules/Calculators/A&P                          Icepack. Xrays.   Reviewed nursing notes and prior charts for additional history.   Xrays reviewed/interpreted by me - +proximal fibula fracture.   Left knee immobilizer. Crutches/walker.   Orthopedics on call consulted - discussed pt and xrays with Dr Griffin Basil - he indicates no additional advanced imaging in ED is needed, knee immobilizer, f/u in office.   No meds pta. Hydrocodone po.   Pain controlled. No numbness/weakness. No increase in swelling. Compartments of leg soft not tense.      Final Clinical Impression(s) / ED Diagnoses Final diagnoses:  None    Rx / DC Orders ED Discharge Orders    None       Lajean Saver, MD 03/21/20 1722

## 2020-03-21 NOTE — ED Notes (Addendum)
Ortho tech at bedside 

## 2020-03-21 NOTE — Discharge Instructions (Addendum)
It was our pleasure to provide your ER care today - we hope that you feel better.  Wear knee immobilizer. Use crutches or walker for support.   Icepack/cold to sore area.   Take hydrocodone as need for pain - no driving when taking.   Follow up with orthopedist this coming week - call office Monday AM to arrange appointment.   Return to ER if worse, new symptoms, severe or intractable pain, numbness/weakness, headache, or other concern.

## 2020-03-21 NOTE — Progress Notes (Signed)
Orthopedic Tech Progress Note Patient Details:  JEDIDIAH DEMARTINI March 04, 1946 [id number]  Ortho Devices Type of Ortho Device: Knee Immobilizer Ortho Device/Splint Location: LLE Ortho Device/Splint Interventions: Ordered, Application, Adjustment   Post Interventions Patient Tolerated: Well Instructions Provided: Poper ambulation with device, Care of device, Adjustment of device   Jaymes Hang 03/21/2020, 6:36 PM

## 2020-03-24 DIAGNOSIS — S82892A Other fracture of left lower leg, initial encounter for closed fracture: Secondary | ICD-10-CM | POA: Diagnosis not present

## 2020-03-27 DIAGNOSIS — M25562 Pain in left knee: Secondary | ICD-10-CM | POA: Diagnosis not present

## 2020-03-31 DIAGNOSIS — M25562 Pain in left knee: Secondary | ICD-10-CM | POA: Diagnosis not present

## 2020-04-03 DIAGNOSIS — R262 Difficulty in walking, not elsewhere classified: Secondary | ICD-10-CM | POA: Diagnosis not present

## 2020-04-03 DIAGNOSIS — M6281 Muscle weakness (generalized): Secondary | ICD-10-CM | POA: Diagnosis not present

## 2020-04-03 DIAGNOSIS — M25462 Effusion, left knee: Secondary | ICD-10-CM | POA: Diagnosis not present

## 2020-04-03 DIAGNOSIS — M25662 Stiffness of left knee, not elsewhere classified: Secondary | ICD-10-CM | POA: Diagnosis not present

## 2020-04-07 DIAGNOSIS — M25662 Stiffness of left knee, not elsewhere classified: Secondary | ICD-10-CM | POA: Diagnosis not present

## 2020-04-07 DIAGNOSIS — M25462 Effusion, left knee: Secondary | ICD-10-CM | POA: Diagnosis not present

## 2020-04-07 DIAGNOSIS — R262 Difficulty in walking, not elsewhere classified: Secondary | ICD-10-CM | POA: Diagnosis not present

## 2020-04-07 DIAGNOSIS — M6281 Muscle weakness (generalized): Secondary | ICD-10-CM | POA: Diagnosis not present

## 2020-04-13 ENCOUNTER — Ambulatory Visit (INDEPENDENT_AMBULATORY_CARE_PROVIDER_SITE_OTHER): Payer: Medicare PPO

## 2020-04-13 ENCOUNTER — Other Ambulatory Visit: Payer: Self-pay

## 2020-04-13 DIAGNOSIS — I4891 Unspecified atrial fibrillation: Secondary | ICD-10-CM | POA: Diagnosis not present

## 2020-04-13 DIAGNOSIS — Z5181 Encounter for therapeutic drug level monitoring: Secondary | ICD-10-CM | POA: Diagnosis not present

## 2020-04-13 DIAGNOSIS — Z7901 Long term (current) use of anticoagulants: Secondary | ICD-10-CM | POA: Diagnosis not present

## 2020-04-13 LAB — POCT INR: INR: 2.6 (ref 2.0–3.0)

## 2020-04-13 NOTE — Patient Instructions (Signed)
Continue taking warfarin 5mg daily and repeat INR in 6 weeks  

## 2020-04-14 DIAGNOSIS — M6281 Muscle weakness (generalized): Secondary | ICD-10-CM | POA: Diagnosis not present

## 2020-04-14 DIAGNOSIS — M25662 Stiffness of left knee, not elsewhere classified: Secondary | ICD-10-CM | POA: Diagnosis not present

## 2020-04-14 DIAGNOSIS — R262 Difficulty in walking, not elsewhere classified: Secondary | ICD-10-CM | POA: Diagnosis not present

## 2020-04-14 DIAGNOSIS — M25462 Effusion, left knee: Secondary | ICD-10-CM | POA: Diagnosis not present

## 2020-04-16 DIAGNOSIS — M25462 Effusion, left knee: Secondary | ICD-10-CM | POA: Diagnosis not present

## 2020-04-16 DIAGNOSIS — M25662 Stiffness of left knee, not elsewhere classified: Secondary | ICD-10-CM | POA: Diagnosis not present

## 2020-04-16 DIAGNOSIS — R262 Difficulty in walking, not elsewhere classified: Secondary | ICD-10-CM | POA: Diagnosis not present

## 2020-04-16 DIAGNOSIS — M6281 Muscle weakness (generalized): Secondary | ICD-10-CM | POA: Diagnosis not present

## 2020-04-22 DIAGNOSIS — M25462 Effusion, left knee: Secondary | ICD-10-CM | POA: Diagnosis not present

## 2020-04-22 DIAGNOSIS — M25662 Stiffness of left knee, not elsewhere classified: Secondary | ICD-10-CM | POA: Diagnosis not present

## 2020-04-22 DIAGNOSIS — M6281 Muscle weakness (generalized): Secondary | ICD-10-CM | POA: Diagnosis not present

## 2020-04-22 DIAGNOSIS — R262 Difficulty in walking, not elsewhere classified: Secondary | ICD-10-CM | POA: Diagnosis not present

## 2020-04-24 DIAGNOSIS — M6281 Muscle weakness (generalized): Secondary | ICD-10-CM | POA: Diagnosis not present

## 2020-04-24 DIAGNOSIS — M25662 Stiffness of left knee, not elsewhere classified: Secondary | ICD-10-CM | POA: Diagnosis not present

## 2020-04-24 DIAGNOSIS — R262 Difficulty in walking, not elsewhere classified: Secondary | ICD-10-CM | POA: Diagnosis not present

## 2020-04-24 DIAGNOSIS — M25462 Effusion, left knee: Secondary | ICD-10-CM | POA: Diagnosis not present

## 2020-04-28 DIAGNOSIS — M25462 Effusion, left knee: Secondary | ICD-10-CM | POA: Diagnosis not present

## 2020-04-28 DIAGNOSIS — R262 Difficulty in walking, not elsewhere classified: Secondary | ICD-10-CM | POA: Diagnosis not present

## 2020-04-28 DIAGNOSIS — M25662 Stiffness of left knee, not elsewhere classified: Secondary | ICD-10-CM | POA: Diagnosis not present

## 2020-04-28 DIAGNOSIS — M6281 Muscle weakness (generalized): Secondary | ICD-10-CM | POA: Diagnosis not present

## 2020-05-01 DIAGNOSIS — M6281 Muscle weakness (generalized): Secondary | ICD-10-CM | POA: Diagnosis not present

## 2020-05-01 DIAGNOSIS — M25662 Stiffness of left knee, not elsewhere classified: Secondary | ICD-10-CM | POA: Diagnosis not present

## 2020-05-01 DIAGNOSIS — M25462 Effusion, left knee: Secondary | ICD-10-CM | POA: Diagnosis not present

## 2020-05-01 DIAGNOSIS — R262 Difficulty in walking, not elsewhere classified: Secondary | ICD-10-CM | POA: Diagnosis not present

## 2020-05-12 DIAGNOSIS — M6281 Muscle weakness (generalized): Secondary | ICD-10-CM | POA: Diagnosis not present

## 2020-05-12 DIAGNOSIS — R262 Difficulty in walking, not elsewhere classified: Secondary | ICD-10-CM | POA: Diagnosis not present

## 2020-05-12 DIAGNOSIS — M25662 Stiffness of left knee, not elsewhere classified: Secondary | ICD-10-CM | POA: Diagnosis not present

## 2020-05-12 DIAGNOSIS — M25462 Effusion, left knee: Secondary | ICD-10-CM | POA: Diagnosis not present

## 2020-05-14 DIAGNOSIS — R262 Difficulty in walking, not elsewhere classified: Secondary | ICD-10-CM | POA: Diagnosis not present

## 2020-05-14 DIAGNOSIS — M25462 Effusion, left knee: Secondary | ICD-10-CM | POA: Diagnosis not present

## 2020-05-14 DIAGNOSIS — M25662 Stiffness of left knee, not elsewhere classified: Secondary | ICD-10-CM | POA: Diagnosis not present

## 2020-05-14 DIAGNOSIS — M6281 Muscle weakness (generalized): Secondary | ICD-10-CM | POA: Diagnosis not present

## 2020-05-18 DIAGNOSIS — R262 Difficulty in walking, not elsewhere classified: Secondary | ICD-10-CM | POA: Diagnosis not present

## 2020-05-18 DIAGNOSIS — M6281 Muscle weakness (generalized): Secondary | ICD-10-CM | POA: Diagnosis not present

## 2020-05-18 DIAGNOSIS — M25662 Stiffness of left knee, not elsewhere classified: Secondary | ICD-10-CM | POA: Diagnosis not present

## 2020-05-18 DIAGNOSIS — M25462 Effusion, left knee: Secondary | ICD-10-CM | POA: Diagnosis not present

## 2020-05-20 DIAGNOSIS — M25662 Stiffness of left knee, not elsewhere classified: Secondary | ICD-10-CM | POA: Diagnosis not present

## 2020-05-20 DIAGNOSIS — M6281 Muscle weakness (generalized): Secondary | ICD-10-CM | POA: Diagnosis not present

## 2020-05-20 DIAGNOSIS — R262 Difficulty in walking, not elsewhere classified: Secondary | ICD-10-CM | POA: Diagnosis not present

## 2020-05-20 DIAGNOSIS — M25462 Effusion, left knee: Secondary | ICD-10-CM | POA: Diagnosis not present

## 2020-05-26 ENCOUNTER — Ambulatory Visit (INDEPENDENT_AMBULATORY_CARE_PROVIDER_SITE_OTHER): Payer: Medicare PPO | Admitting: Pharmacist

## 2020-05-26 ENCOUNTER — Other Ambulatory Visit: Payer: Self-pay

## 2020-05-26 DIAGNOSIS — I4891 Unspecified atrial fibrillation: Secondary | ICD-10-CM | POA: Diagnosis not present

## 2020-05-26 DIAGNOSIS — M25662 Stiffness of left knee, not elsewhere classified: Secondary | ICD-10-CM | POA: Diagnosis not present

## 2020-05-26 DIAGNOSIS — Z7901 Long term (current) use of anticoagulants: Secondary | ICD-10-CM

## 2020-05-26 DIAGNOSIS — R262 Difficulty in walking, not elsewhere classified: Secondary | ICD-10-CM | POA: Diagnosis not present

## 2020-05-26 DIAGNOSIS — M25462 Effusion, left knee: Secondary | ICD-10-CM | POA: Diagnosis not present

## 2020-05-26 DIAGNOSIS — M6281 Muscle weakness (generalized): Secondary | ICD-10-CM | POA: Diagnosis not present

## 2020-05-26 LAB — POCT INR: INR: 3.1 — AB (ref 2.0–3.0)

## 2020-05-28 DIAGNOSIS — M6281 Muscle weakness (generalized): Secondary | ICD-10-CM | POA: Diagnosis not present

## 2020-05-28 DIAGNOSIS — R262 Difficulty in walking, not elsewhere classified: Secondary | ICD-10-CM | POA: Diagnosis not present

## 2020-05-28 DIAGNOSIS — M25562 Pain in left knee: Secondary | ICD-10-CM | POA: Diagnosis not present

## 2020-05-28 DIAGNOSIS — M25662 Stiffness of left knee, not elsewhere classified: Secondary | ICD-10-CM | POA: Diagnosis not present

## 2020-06-01 DIAGNOSIS — R262 Difficulty in walking, not elsewhere classified: Secondary | ICD-10-CM | POA: Diagnosis not present

## 2020-06-01 DIAGNOSIS — M6281 Muscle weakness (generalized): Secondary | ICD-10-CM | POA: Diagnosis not present

## 2020-06-01 DIAGNOSIS — M25462 Effusion, left knee: Secondary | ICD-10-CM | POA: Diagnosis not present

## 2020-06-01 DIAGNOSIS — M25662 Stiffness of left knee, not elsewhere classified: Secondary | ICD-10-CM | POA: Diagnosis not present

## 2020-06-03 DIAGNOSIS — M25462 Effusion, left knee: Secondary | ICD-10-CM | POA: Diagnosis not present

## 2020-06-03 DIAGNOSIS — M25662 Stiffness of left knee, not elsewhere classified: Secondary | ICD-10-CM | POA: Diagnosis not present

## 2020-06-03 DIAGNOSIS — M6281 Muscle weakness (generalized): Secondary | ICD-10-CM | POA: Diagnosis not present

## 2020-06-03 DIAGNOSIS — R262 Difficulty in walking, not elsewhere classified: Secondary | ICD-10-CM | POA: Diagnosis not present

## 2020-06-08 DIAGNOSIS — M25462 Effusion, left knee: Secondary | ICD-10-CM | POA: Diagnosis not present

## 2020-06-08 DIAGNOSIS — R262 Difficulty in walking, not elsewhere classified: Secondary | ICD-10-CM | POA: Diagnosis not present

## 2020-06-08 DIAGNOSIS — M6281 Muscle weakness (generalized): Secondary | ICD-10-CM | POA: Diagnosis not present

## 2020-06-08 DIAGNOSIS — M25662 Stiffness of left knee, not elsewhere classified: Secondary | ICD-10-CM | POA: Diagnosis not present

## 2020-06-10 DIAGNOSIS — M6281 Muscle weakness (generalized): Secondary | ICD-10-CM | POA: Diagnosis not present

## 2020-06-10 DIAGNOSIS — R262 Difficulty in walking, not elsewhere classified: Secondary | ICD-10-CM | POA: Diagnosis not present

## 2020-06-10 DIAGNOSIS — M25662 Stiffness of left knee, not elsewhere classified: Secondary | ICD-10-CM | POA: Diagnosis not present

## 2020-06-10 DIAGNOSIS — M25462 Effusion, left knee: Secondary | ICD-10-CM | POA: Diagnosis not present

## 2020-06-11 DIAGNOSIS — M25462 Effusion, left knee: Secondary | ICD-10-CM | POA: Diagnosis not present

## 2020-06-11 DIAGNOSIS — E7800 Pure hypercholesterolemia, unspecified: Secondary | ICD-10-CM | POA: Diagnosis not present

## 2020-06-17 DIAGNOSIS — M6281 Muscle weakness (generalized): Secondary | ICD-10-CM | POA: Diagnosis not present

## 2020-06-17 DIAGNOSIS — R262 Difficulty in walking, not elsewhere classified: Secondary | ICD-10-CM | POA: Diagnosis not present

## 2020-06-17 DIAGNOSIS — M25662 Stiffness of left knee, not elsewhere classified: Secondary | ICD-10-CM | POA: Diagnosis not present

## 2020-06-17 DIAGNOSIS — M25462 Effusion, left knee: Secondary | ICD-10-CM | POA: Diagnosis not present

## 2020-06-21 ENCOUNTER — Other Ambulatory Visit: Payer: Self-pay | Admitting: Internal Medicine

## 2020-06-24 DIAGNOSIS — M25462 Effusion, left knee: Secondary | ICD-10-CM | POA: Diagnosis not present

## 2020-06-24 DIAGNOSIS — M25662 Stiffness of left knee, not elsewhere classified: Secondary | ICD-10-CM | POA: Diagnosis not present

## 2020-06-24 DIAGNOSIS — M6281 Muscle weakness (generalized): Secondary | ICD-10-CM | POA: Diagnosis not present

## 2020-06-24 DIAGNOSIS — R262 Difficulty in walking, not elsewhere classified: Secondary | ICD-10-CM | POA: Diagnosis not present

## 2020-07-01 ENCOUNTER — Other Ambulatory Visit: Payer: Self-pay | Admitting: Internal Medicine

## 2020-07-08 ENCOUNTER — Other Ambulatory Visit: Payer: Self-pay

## 2020-07-08 ENCOUNTER — Ambulatory Visit (INDEPENDENT_AMBULATORY_CARE_PROVIDER_SITE_OTHER): Payer: Medicare PPO | Admitting: Pharmacist

## 2020-07-08 DIAGNOSIS — Z7901 Long term (current) use of anticoagulants: Secondary | ICD-10-CM | POA: Diagnosis not present

## 2020-07-08 DIAGNOSIS — I4891 Unspecified atrial fibrillation: Secondary | ICD-10-CM | POA: Diagnosis not present

## 2020-07-08 LAB — POCT INR: INR: 3.1 — AB (ref 2.0–3.0)

## 2020-07-15 DIAGNOSIS — Z961 Presence of intraocular lens: Secondary | ICD-10-CM | POA: Diagnosis not present

## 2020-07-15 DIAGNOSIS — H43813 Vitreous degeneration, bilateral: Secondary | ICD-10-CM | POA: Diagnosis not present

## 2020-07-15 DIAGNOSIS — H35373 Puckering of macula, bilateral: Secondary | ICD-10-CM | POA: Diagnosis not present

## 2020-07-23 DIAGNOSIS — M25462 Effusion, left knee: Secondary | ICD-10-CM | POA: Diagnosis not present

## 2020-08-05 ENCOUNTER — Other Ambulatory Visit: Payer: Self-pay

## 2020-08-05 ENCOUNTER — Ambulatory Visit (INDEPENDENT_AMBULATORY_CARE_PROVIDER_SITE_OTHER): Payer: Medicare PPO

## 2020-08-05 DIAGNOSIS — I4891 Unspecified atrial fibrillation: Secondary | ICD-10-CM | POA: Diagnosis not present

## 2020-08-05 DIAGNOSIS — Z7901 Long term (current) use of anticoagulants: Secondary | ICD-10-CM | POA: Diagnosis not present

## 2020-08-05 DIAGNOSIS — Z5181 Encounter for therapeutic drug level monitoring: Secondary | ICD-10-CM

## 2020-08-05 LAB — POCT INR: INR: 3.8 — AB (ref 2.0–3.0)

## 2020-08-05 NOTE — Patient Instructions (Signed)
Hold tonight only and then Continue taking warfarin 5mg  daily and repeat INR in 4 weeks.

## 2020-08-08 ENCOUNTER — Other Ambulatory Visit: Payer: Self-pay | Admitting: Physician Assistant

## 2020-09-01 ENCOUNTER — Other Ambulatory Visit: Payer: Self-pay | Admitting: Internal Medicine

## 2020-09-02 ENCOUNTER — Ambulatory Visit (INDEPENDENT_AMBULATORY_CARE_PROVIDER_SITE_OTHER): Payer: Medicare PPO

## 2020-09-02 ENCOUNTER — Other Ambulatory Visit: Payer: Self-pay

## 2020-09-02 DIAGNOSIS — I4891 Unspecified atrial fibrillation: Secondary | ICD-10-CM | POA: Diagnosis not present

## 2020-09-02 DIAGNOSIS — Z7901 Long term (current) use of anticoagulants: Secondary | ICD-10-CM | POA: Diagnosis not present

## 2020-09-02 DIAGNOSIS — Z5181 Encounter for therapeutic drug level monitoring: Secondary | ICD-10-CM | POA: Diagnosis not present

## 2020-09-02 LAB — POCT INR: INR: 3.1 — AB (ref 2.0–3.0)

## 2020-09-02 NOTE — Patient Instructions (Signed)
Continue taking warfarin 5mg  daily and repeat INR in 6 weeks. Eat greens tonight.

## 2020-09-05 ENCOUNTER — Other Ambulatory Visit: Payer: Self-pay | Admitting: Internal Medicine

## 2020-09-07 NOTE — Telephone Encounter (Signed)
Rx(s) sent to pharmacy electronically.  

## 2020-09-25 ENCOUNTER — Other Ambulatory Visit: Payer: Self-pay | Admitting: Internal Medicine

## 2020-10-05 ENCOUNTER — Telehealth: Payer: Self-pay | Admitting: Internal Medicine

## 2020-10-05 NOTE — Telephone Encounter (Signed)
Asked for cardiac clearance for colonoscopy and to hold warfarin - procedure is scheduled in 3 days. He has a low CHADSVASC score of 2 - ok to hold warfarin now - he may not have totally reversed his INR by the procedure. Restart warfarin afterwards. Will cc: the anticoagulation clinic at [facility] to follow-up.  Pixie Casino, MD, [facility], [place] Director of the Advanced Lipid Disorders &  Cardiovascular Risk Reduction Clinic Diplomate of the American Board of Clinical Lipidology Attending Cardiologist  Direct Dial: [phone]  Fax: [phone]  Website:  [email or web address]

## 2020-10-05 NOTE — Telephone Encounter (Signed)
-----   Message from Salley Slaughter, PA-C sent at 10/05/2020  1:12 PM EDT ----- Hi Dr. Debara Pickett, Patient has colonoscopy scheduled Thursday 6/23 - OK to hold Coumadin starting today? Thanks, Bryson Ha [facility])

## 2020-10-08 DIAGNOSIS — D123 Benign neoplasm of transverse colon: Secondary | ICD-10-CM | POA: Diagnosis not present

## 2020-10-08 DIAGNOSIS — Z86010 Personal history of colonic polyps: Secondary | ICD-10-CM | POA: Diagnosis not present

## 2020-10-08 DIAGNOSIS — D124 Benign neoplasm of descending colon: Secondary | ICD-10-CM | POA: Diagnosis not present

## 2020-10-08 DIAGNOSIS — K5730 Diverticulosis of large intestine without perforation or abscess without bleeding: Secondary | ICD-10-CM | POA: Diagnosis not present

## 2020-10-14 ENCOUNTER — Other Ambulatory Visit: Payer: Self-pay

## 2020-10-14 ENCOUNTER — Ambulatory Visit (INDEPENDENT_AMBULATORY_CARE_PROVIDER_SITE_OTHER): Payer: Medicare PPO | Admitting: Pharmacist Clinician (PhC)/ Clinical Pharmacy Specialist

## 2020-10-14 DIAGNOSIS — D123 Benign neoplasm of transverse colon: Secondary | ICD-10-CM | POA: Diagnosis not present

## 2020-10-14 DIAGNOSIS — Z7901 Long term (current) use of anticoagulants: Secondary | ICD-10-CM | POA: Diagnosis not present

## 2020-10-14 DIAGNOSIS — I4891 Unspecified atrial fibrillation: Secondary | ICD-10-CM

## 2020-10-14 DIAGNOSIS — D124 Benign neoplasm of descending colon: Secondary | ICD-10-CM | POA: Diagnosis not present

## 2020-10-14 LAB — POCT INR: INR: 1.9 — AB (ref 2.0–3.0)

## 2020-11-27 ENCOUNTER — Other Ambulatory Visit: Payer: Self-pay

## 2020-11-27 ENCOUNTER — Ambulatory Visit (INDEPENDENT_AMBULATORY_CARE_PROVIDER_SITE_OTHER): Payer: Medicare PPO

## 2020-11-27 DIAGNOSIS — Z7901 Long term (current) use of anticoagulants: Secondary | ICD-10-CM

## 2020-11-27 DIAGNOSIS — Z5181 Encounter for therapeutic drug level monitoring: Secondary | ICD-10-CM | POA: Diagnosis not present

## 2020-11-27 DIAGNOSIS — I4891 Unspecified atrial fibrillation: Secondary | ICD-10-CM

## 2020-11-27 LAB — POCT INR: INR: 3.4 — AB (ref 2.0–3.0)

## 2020-11-27 NOTE — Patient Instructions (Signed)
Hold tonight only and then Continue taking warfarin '5mg'$  daily and repeat INR in 4 weeks.

## 2020-12-03 ENCOUNTER — Other Ambulatory Visit: Payer: Self-pay | Admitting: Internal Medicine

## 2020-12-04 DIAGNOSIS — R739 Hyperglycemia, unspecified: Secondary | ICD-10-CM | POA: Diagnosis not present

## 2020-12-04 DIAGNOSIS — Z125 Encounter for screening for malignant neoplasm of prostate: Secondary | ICD-10-CM | POA: Diagnosis not present

## 2020-12-04 DIAGNOSIS — Z0000 Encounter for general adult medical examination without abnormal findings: Secondary | ICD-10-CM | POA: Diagnosis not present

## 2020-12-04 DIAGNOSIS — E7801 Familial hypercholesterolemia: Secondary | ICD-10-CM | POA: Diagnosis not present

## 2020-12-11 DIAGNOSIS — R7303 Prediabetes: Secondary | ICD-10-CM | POA: Diagnosis not present

## 2020-12-11 DIAGNOSIS — I2510 Atherosclerotic heart disease of native coronary artery without angina pectoris: Secondary | ICD-10-CM | POA: Diagnosis not present

## 2020-12-11 DIAGNOSIS — Z0000 Encounter for general adult medical examination without abnormal findings: Secondary | ICD-10-CM | POA: Diagnosis not present

## 2020-12-11 DIAGNOSIS — E7800 Pure hypercholesterolemia, unspecified: Secondary | ICD-10-CM | POA: Diagnosis not present

## 2020-12-11 DIAGNOSIS — K219 Gastro-esophageal reflux disease without esophagitis: Secondary | ICD-10-CM | POA: Diagnosis not present

## 2020-12-11 DIAGNOSIS — N183 Chronic kidney disease, stage 3 unspecified: Secondary | ICD-10-CM | POA: Diagnosis not present

## 2020-12-20 ENCOUNTER — Other Ambulatory Visit: Payer: Self-pay | Admitting: Internal Medicine

## 2020-12-25 ENCOUNTER — Ambulatory Visit (INDEPENDENT_AMBULATORY_CARE_PROVIDER_SITE_OTHER): Payer: Medicare PPO | Admitting: *Deleted

## 2020-12-25 ENCOUNTER — Other Ambulatory Visit: Payer: Self-pay

## 2020-12-25 DIAGNOSIS — Z7901 Long term (current) use of anticoagulants: Secondary | ICD-10-CM | POA: Diagnosis not present

## 2020-12-25 DIAGNOSIS — I4891 Unspecified atrial fibrillation: Secondary | ICD-10-CM | POA: Diagnosis not present

## 2020-12-25 LAB — POCT INR: INR: 2.9 (ref 2.0–3.0)

## 2020-12-25 NOTE — Patient Instructions (Addendum)
Description   Continue taking warfarin '5mg'$  daily and repeat INR in 4 weeks. Coumadin clinic [phone]

## 2021-01-04 ENCOUNTER — Other Ambulatory Visit: Payer: Self-pay | Admitting: Internal Medicine

## 2021-01-14 DIAGNOSIS — I10 Essential (primary) hypertension: Secondary | ICD-10-CM | POA: Diagnosis not present

## 2021-01-14 DIAGNOSIS — E785 Hyperlipidemia, unspecified: Secondary | ICD-10-CM | POA: Diagnosis not present

## 2021-01-14 DIAGNOSIS — I4891 Unspecified atrial fibrillation: Secondary | ICD-10-CM | POA: Diagnosis not present

## 2021-01-14 DIAGNOSIS — G8929 Other chronic pain: Secondary | ICD-10-CM | POA: Diagnosis not present

## 2021-01-14 DIAGNOSIS — H538 Other visual disturbances: Secondary | ICD-10-CM | POA: Diagnosis not present

## 2021-01-14 DIAGNOSIS — I2510 Atherosclerotic heart disease of native coronary artery without angina pectoris: Secondary | ICD-10-CM | POA: Diagnosis not present

## 2021-01-14 DIAGNOSIS — E669 Obesity, unspecified: Secondary | ICD-10-CM | POA: Diagnosis not present

## 2021-01-14 DIAGNOSIS — I739 Peripheral vascular disease, unspecified: Secondary | ICD-10-CM | POA: Diagnosis not present

## 2021-01-14 DIAGNOSIS — D6869 Other thrombophilia: Secondary | ICD-10-CM | POA: Diagnosis not present

## 2021-01-16 ENCOUNTER — Other Ambulatory Visit: Payer: Self-pay | Admitting: Internal Medicine

## 2021-01-21 ENCOUNTER — Other Ambulatory Visit: Payer: Self-pay

## 2021-01-21 ENCOUNTER — Ambulatory Visit (INDEPENDENT_AMBULATORY_CARE_PROVIDER_SITE_OTHER): Payer: Medicare PPO

## 2021-01-21 DIAGNOSIS — I4891 Unspecified atrial fibrillation: Secondary | ICD-10-CM

## 2021-01-21 DIAGNOSIS — Z7901 Long term (current) use of anticoagulants: Secondary | ICD-10-CM | POA: Diagnosis not present

## 2021-01-21 DIAGNOSIS — Z5181 Encounter for therapeutic drug level monitoring: Secondary | ICD-10-CM

## 2021-01-21 LAB — POCT INR: INR: 4 — AB (ref 2.0–3.0)

## 2021-01-21 NOTE — Patient Instructions (Signed)
Hold tonight only and then Continue taking warfarin 5mg  daily and repeat INR in 2 weeks. Coumadin clinic [phone]

## 2021-01-26 ENCOUNTER — Telehealth: Payer: Self-pay | Admitting: Internal Medicine

## 2021-01-26 NOTE — Telephone Encounter (Signed)
*  STAT* If patient is at the pharmacy, call can be transferred to refill team.   1. Which medications need to be refilled? (please list name of each medication and dose if known)  metoprolol tartrate (LOPRESSOR) 50 MG tablet  2. Which pharmacy/location (including street and city if local pharmacy) is medication to be sent to?  [id number] [place], [place] - [address]  3. Do they need a 30 day or 90 day supply? [place]

## 2021-02-03 DIAGNOSIS — Z85828 Personal history of other malignant neoplasm of skin: Secondary | ICD-10-CM | POA: Diagnosis not present

## 2021-02-03 DIAGNOSIS — L821 Other seborrheic keratosis: Secondary | ICD-10-CM | POA: Diagnosis not present

## 2021-02-03 DIAGNOSIS — D2222 Melanocytic nevi of left ear and external auricular canal: Secondary | ICD-10-CM | POA: Diagnosis not present

## 2021-02-03 DIAGNOSIS — L814 Other melanin hyperpigmentation: Secondary | ICD-10-CM | POA: Diagnosis not present

## 2021-02-03 DIAGNOSIS — D225 Melanocytic nevi of trunk: Secondary | ICD-10-CM | POA: Diagnosis not present

## 2021-02-03 DIAGNOSIS — L578 Other skin changes due to chronic exposure to nonionizing radiation: Secondary | ICD-10-CM | POA: Diagnosis not present

## 2021-02-03 DIAGNOSIS — Z23 Encounter for immunization: Secondary | ICD-10-CM | POA: Diagnosis not present

## 2021-02-03 DIAGNOSIS — L570 Actinic keratosis: Secondary | ICD-10-CM | POA: Diagnosis not present

## 2021-02-03 DIAGNOSIS — Z86018 Personal history of other benign neoplasm: Secondary | ICD-10-CM | POA: Diagnosis not present

## 2021-02-04 ENCOUNTER — Other Ambulatory Visit: Payer: Self-pay

## 2021-02-04 ENCOUNTER — Ambulatory Visit (INDEPENDENT_AMBULATORY_CARE_PROVIDER_SITE_OTHER): Payer: Medicare PPO

## 2021-02-04 DIAGNOSIS — I4891 Unspecified atrial fibrillation: Secondary | ICD-10-CM

## 2021-02-04 DIAGNOSIS — Z5181 Encounter for therapeutic drug level monitoring: Secondary | ICD-10-CM

## 2021-02-04 DIAGNOSIS — Z7901 Long term (current) use of anticoagulants: Secondary | ICD-10-CM | POA: Diagnosis not present

## 2021-02-04 LAB — POCT INR: INR: 3.4 — AB (ref 2.0–3.0)

## 2021-02-04 NOTE — Patient Instructions (Signed)
Hold tonight only and then decrease to 5mg  daily, except 2.5 mg on Wednesday and repeat INR in 3 weeks. Coumadin clinic [phone]

## 2021-02-06 ENCOUNTER — Other Ambulatory Visit: Payer: Self-pay | Admitting: Internal Medicine

## 2021-02-08 ENCOUNTER — Other Ambulatory Visit: Payer: Self-pay

## 2021-02-09 MED ORDER — METOPROLOL TARTRATE 50 MG PO TABS: 50.0000 mg | ORAL_TABLET | Freq: Two times a day (BID) | ORAL | 1 refills | Status: DC

## 2021-02-09 NOTE — Telephone Encounter (Signed)
Metoprolol tartrate refilled with enough supply to last until his 06/2021 appointment

## 2021-02-11 DIAGNOSIS — Z0000 Encounter for general adult medical examination without abnormal findings: Secondary | ICD-10-CM | POA: Diagnosis not present

## 2021-02-16 DIAGNOSIS — K219 Gastro-esophageal reflux disease without esophagitis: Secondary | ICD-10-CM | POA: Diagnosis not present

## 2021-02-16 DIAGNOSIS — Z23 Encounter for immunization: Secondary | ICD-10-CM | POA: Diagnosis not present

## 2021-02-25 ENCOUNTER — Other Ambulatory Visit: Payer: Self-pay

## 2021-02-25 ENCOUNTER — Ambulatory Visit (INDEPENDENT_AMBULATORY_CARE_PROVIDER_SITE_OTHER): Payer: Medicare PPO

## 2021-02-25 DIAGNOSIS — Z5181 Encounter for therapeutic drug level monitoring: Secondary | ICD-10-CM

## 2021-02-25 DIAGNOSIS — I4891 Unspecified atrial fibrillation: Secondary | ICD-10-CM

## 2021-02-25 DIAGNOSIS — Z7901 Long term (current) use of anticoagulants: Secondary | ICD-10-CM | POA: Diagnosis not present

## 2021-02-25 LAB — POCT INR: INR: 2.7 (ref 2.0–3.0)

## 2021-02-25 NOTE — Patient Instructions (Signed)
Continue taking 5mg  daily, except 2.5 mg on Wednesday and repeat INR in 6 weeks. Coumadin clinic [phone]

## 2021-03-10 ENCOUNTER — Ambulatory Visit: Payer: Medicare PPO | Admitting: Internal Medicine

## 2021-03-10 ENCOUNTER — Other Ambulatory Visit: Payer: Self-pay

## 2021-03-10 ENCOUNTER — Encounter: Payer: Self-pay | Admitting: Internal Medicine

## 2021-03-10 VITALS — BP 116/76 | HR 82 | Ht 75.0 in | Wt 261.0 lb

## 2021-03-10 DIAGNOSIS — E785 Hyperlipidemia, unspecified: Secondary | ICD-10-CM

## 2021-03-10 DIAGNOSIS — Z7901 Long term (current) use of anticoagulants: Secondary | ICD-10-CM

## 2021-03-10 DIAGNOSIS — I10 Essential (primary) hypertension: Secondary | ICD-10-CM

## 2021-03-10 DIAGNOSIS — I4821 Permanent atrial fibrillation: Secondary | ICD-10-CM | POA: Diagnosis not present

## 2021-03-10 DIAGNOSIS — I2510 Atherosclerotic heart disease of native coronary artery without angina pectoris: Secondary | ICD-10-CM | POA: Diagnosis not present

## 2021-03-10 NOTE — Progress Notes (Signed)
OFFICE NOTE  Chief Complaint:  Follow-up  Primary Care Physician: Deland Pretty, MD  HPI:  Gerald Frey is a 75 year old gentleman who I am following for history of chronic A-fib on Coumadin, hypertension, dyslipidemia. Overall, again doing fairly well without any new concerns today. He denies any shortness of breath, palpitations, presyncope, or syncopal symptoms. INR in the office today was 3.0 which is stable. This is a history of coronary disease and had a stent to the LAD in 2005.  He has no anginal complaints. Recently he has missed some of his medications and he can definitely tell he feels worse, probably because of RVR.  However, he has been chopping wood without difficulty.  I saw Gerald Frey back in the office today. He is again without complaints. He denies any chest pain or worsening shortness of breath. He remains in chronic atrial fibrillation with an INR of 2.8 in the office. He's been very stable we discussed possibly changing to a direct oral anticoagulant before, but he prefers to stay on warfarin.  04/01/2016  Gerald Frey returns today for follow-up. He reports feeling well, although has gained weight over the last year. He denies any chest pain or worsening shortness of breath. His INR checked today was 2.9. It's been very stable. We again discussed possibly switching to a novel oral anticoagulant, however he prefers to stay on warfarin. Since he's been very well controlled, it's difficult to argue that there would be additional benefit from a NOAC. He's not had any significant bleeding complications. His last LV assessment was in 2009 when EF was 45-50% at that time. He is overdue for cholesterol testing as well.  04/03/2017  Gerald Frey was seen today in follow-up.  Is been a year since we last saw him.  He has been working on weight loss and is 1 pound lighter than we saw him last year which was lower than his previous weight.  He denies any chest pain  or worsening shortness of breath.  Blood pressure is well controlled today.  He is in long-standing persistent if not permanent atrial fibrillation.  He wishes to remain on warfarin and has had very stable INRs.  Ventricular rate is well controlled at 79 today.  LVEF normalized on his last echo in January 2018.  01/02/2018  Gerald Frey returns today for follow-up.  Recently he was seen in the A. fib clinic by Roderic Palau.  He was having symptoms concerning for chest pain.  Subsequently underwent an echocardiogram and stress test.  The stress test was negative for ischemia however showed mildly reduced LV function, but this was likely gating artifact as his echo showed normal systolic function.  He did report that he had some dizziness and felt somewhat dehydrated after working out in the yard.  He wondered if this might of provoked his A. fib.  This did improve with cooling down and drinking some Powerade.  He says since that episode a few months ago he is done very well and has had no further episodes.  He does get occasionally dizzy when he bends over to pick things up and stands up too quickly.  Of note he is on tamsulosin as well.  Most recent lipid profile showed LDL near goal at 71.  03/10/2021  Gerald Frey is seen today for follow-up.  I last saw him in 2019 however he has been followed up recently by [name], PA-C.  Overall he seems to be doing fairly well.  He  is also followed closely in our Coumadin clinic.  He has been on warfarin for some time for atrial fibrillation.  Blood pressure is well controlled today 116/76.  EKG shows A. fib which is likely permanent and rate controlled at 82.  Recently reports has been having some stomach issues.  He feels it might be worsened by aspirin and is inquiring about stopping that.  We also discussed the possibility of him switching off of warfarin to a NOAC, but he was still hesitant.  PMHx:  Past Medical History:  Diagnosis Date   Atrial  fibrillation ([place]) 06/19/2007   2D Echo EF=45-50%   CAD (coronary artery disease) 2005   Stents.   Dyslipidemia    Hypertension    Palpitation     Past Surgical History:  Procedure Laterality Date   APPENDECTOMY  01/2000   CARDIAC CATHETERIZATION  03/18/2004   CYPHER 3.5x50mm stent in the proximal LAD   COLONOSCOPY  07/2004   hx colon polyps   KNEE CARTILAGE SURGERY  11/2002    FAMHx:  Family History  Problem Relation Age of Onset   Emphysema Mother 36   Heart failure Father    Alzheimer's disease Father 67    SOCHx:   reports that he has never smoked. He has never used smokeless tobacco. He reports that he does not drink alcohol and does not use drugs.  ALLERGIES:  No Known Allergies  ROS: Pertinent items noted in HPI and remainder of comprehensive ROS otherwise negative.  HOME MEDS: Current Outpatient Medications  Medication Sig Dispense Refill   aspirin 81 MG tablet Take 81 mg by mouth daily.     cetirizine (ZYRTEC) 10 MG tablet Take 10 mg by mouth as needed.      diltiazem (CARDIZEM CD) 120 MG 24 hr capsule TAKE 1 CAPSULE BY MOUTH EVERY DAY 90 capsule 3   fish oil-omega-3 fatty acids 1000 MG capsule Take 1 g by mouth 2 (two) times daily.      metoprolol tartrate (LOPRESSOR) 50 MG tablet Take 1 tablet (50 mg total) by mouth 2 (two) times daily. 180 tablet 1   rosuvastatin (CRESTOR) 20 MG tablet TAKE 1 TABLET (20 MG TOTAL) BY MOUTH DAILY. KEEP OFFICE VISIT 90 tablet 0   tamsulosin (FLOMAX) 0.4 MG CAPS capsule Take 0.4 mg by mouth daily after supper.     warfarin (COUMADIN) 5 MG tablet TAKE 1 TABLET BY MOUTH EVERY DAY OR AS DIRECTED BY COUMADIN CLINIC 90 tablet 1   HYDROcodone-acetaminophen (NORCO/VICODIN) 5-325 MG tablet Take 1-2 tablets by mouth every 6 (six) hours as needed for moderate pain. 20 tablet 0   No current facility-administered medications for this visit.    LABS/IMAGING: No results found for this or any previous visit (from the past 48  hour(s)). No results found.  VITALS: BP 116/76   Pulse 82   Ht 6\' 3"  (1.905 m)   Wt 261 lb (118.4 kg)   SpO2 95%   BMI 32.62 kg/m   EXAM: General appearance: alert and no distress Neck: no adenopathy, no carotid bruit, no JVD, supple, symmetrical, trachea midline and thyroid not enlarged, symmetric, no tenderness/mass/nodules Lungs: clear to auscultation bilaterally Heart: regular rate and rhythm, S1, S2 normal, no murmur, click, rub or gallop Abdomen: soft, non-tender; bowel sounds normal; no masses,  no organomegaly Extremities: extremities normal, atraumatic, no cyanosis or edema Pulses: 2+ and symmetric Skin: Skin color, texture, turgor normal. No rashes or lesions Neurologic: Grossly normal  EKG: A. fib with  PACs at 82-personally reviewed  ASSESSMENT: Coronary artery disease status post PCI of the LAD 2005 (low risk myoview 10/2017) Permanent atrial fibrillation on warfarin - CHADSVASC 2 (HTN, CHF) on warfarin History of ischemic cardiomyopathy, EF 45-50% (2009), up to 55-60% (2019) Hypertension Dyslipidemia  PLAN: 1.   Mr. Williamsen seems to be doing well without any chest pain or worsening shortness of breath.  Blood pressure is well controlled.  He is cholesterol tested in August showed LDL of 81.  He remains in rate controlled A. fib which is permanent on warfarin.  We discussed possibility of switching to a NOAC because of his concern about GI side effects.  He would also like to stop aspirin which is reasonable at this point.  He will consider possibly switching to the NOAC and discuss it further with the pharmacists at his next Coumadin appointment in December.  Plan otherwise follow-up annually or sooner as necessary.  Pixie Casino, MD, [facility], [place] Director of the Advanced Lipid Disorders &  Cardiovascular Risk Reduction Clinic Attending Cardiologist  Direct Dial: [phone]  Fax: [phone]  Website:   [email or web address] Timpone 03/10/2021, 11:44 AM

## 2021-03-10 NOTE — Patient Instructions (Signed)
Medication Instructions:  STOP: ASPIRIN  *If you need a refill on your cardiac medications before your next appointment, please call your pharmacy*  Follow-Up: At [facility], you and your health needs are our priority.  As part of our continuing mission to provide you with exceptional heart care, we have created designated Provider Care Teams.  These Care Teams include your primary Cardiologist (physician) and Advanced Practice Providers (APPs -  Physician Assistants and Nurse Practitioners) who all work together to provide you with the care you need, when you need it.  Your next appointment:   1 year(s)  The format for your next appointment:   In Person  Provider: Dr. Debara Pickett

## 2021-04-06 ENCOUNTER — Other Ambulatory Visit: Payer: Self-pay

## 2021-04-06 ENCOUNTER — Ambulatory Visit (INDEPENDENT_AMBULATORY_CARE_PROVIDER_SITE_OTHER): Payer: Medicare PPO | Admitting: *Deleted

## 2021-04-06 DIAGNOSIS — I4891 Unspecified atrial fibrillation: Secondary | ICD-10-CM | POA: Diagnosis not present

## 2021-04-06 DIAGNOSIS — Z5181 Encounter for therapeutic drug level monitoring: Secondary | ICD-10-CM

## 2021-04-06 DIAGNOSIS — Z7901 Long term (current) use of anticoagulants: Secondary | ICD-10-CM | POA: Diagnosis not present

## 2021-04-06 LAB — POCT INR: INR: 3 (ref 2.0–3.0)

## 2021-04-06 NOTE — Patient Instructions (Signed)
Description   Continue taking 5mg  daily, except 2.5 mg on Wednesday and repeat INR in 6 weeks. Coumadin clinic [phone]

## 2021-05-18 ENCOUNTER — Other Ambulatory Visit: Payer: Self-pay

## 2021-05-18 ENCOUNTER — Ambulatory Visit (INDEPENDENT_AMBULATORY_CARE_PROVIDER_SITE_OTHER): Payer: Medicare PPO | Admitting: *Deleted

## 2021-05-18 DIAGNOSIS — I4891 Unspecified atrial fibrillation: Secondary | ICD-10-CM

## 2021-05-18 DIAGNOSIS — Z5181 Encounter for therapeutic drug level monitoring: Secondary | ICD-10-CM

## 2021-05-18 DIAGNOSIS — Z7901 Long term (current) use of anticoagulants: Secondary | ICD-10-CM

## 2021-05-18 LAB — POCT INR: INR: 2.2 (ref 2.0–3.0)

## 2021-05-18 NOTE — Patient Instructions (Signed)
Description   Continue taking 5mg  daily, except 2.5 mg on Wednesday and repeat INR in 6 weeks. Coumadin clinic [phone]

## 2021-05-19 ENCOUNTER — Other Ambulatory Visit: Payer: Self-pay | Admitting: Internal Medicine

## 2021-05-26 DIAGNOSIS — S7002XA Contusion of left hip, initial encounter: Secondary | ICD-10-CM | POA: Diagnosis not present

## 2021-05-31 ENCOUNTER — Telehealth: Payer: Self-pay

## 2021-05-31 NOTE — Telephone Encounter (Signed)
Patient walked in with concerns of a bruise on his left hip for over a week. Stated it is a little hematoma there also. He went to ortho they did an xray and everything was okay and told him to come see up. Patient does take Coumadin. Stated it started to get better but he moved wrong again and it started to hurt again the spot more. Encouraged patient to go to Urgent Care if he is really worried about it. We can use an appointment with Dr. Debara Pickett next week and CVRR will check his levels at that time as well. Patient agreed with plan and not additional questions at this time.

## 2021-06-03 ENCOUNTER — Telehealth: Payer: Self-pay

## 2021-06-03 NOTE — Telephone Encounter (Signed)
Lpm that we will check INR at his 2/23 visit with Dr Debara Pickett.  Told him to call, if questions

## 2021-06-10 ENCOUNTER — Encounter: Payer: Self-pay | Admitting: Internal Medicine

## 2021-06-10 ENCOUNTER — Ambulatory Visit: Payer: Medicare PPO | Admitting: Internal Medicine

## 2021-06-10 ENCOUNTER — Ambulatory Visit (INDEPENDENT_AMBULATORY_CARE_PROVIDER_SITE_OTHER): Payer: Medicare PPO

## 2021-06-10 ENCOUNTER — Other Ambulatory Visit: Payer: Self-pay

## 2021-06-10 VITALS — BP 110/66 | HR 62 | Ht 75.0 in | Wt 258.0 lb

## 2021-06-10 DIAGNOSIS — Z5181 Encounter for therapeutic drug level monitoring: Secondary | ICD-10-CM

## 2021-06-10 DIAGNOSIS — Z7901 Long term (current) use of anticoagulants: Secondary | ICD-10-CM | POA: Diagnosis not present

## 2021-06-10 DIAGNOSIS — I4821 Permanent atrial fibrillation: Secondary | ICD-10-CM

## 2021-06-10 DIAGNOSIS — I4891 Unspecified atrial fibrillation: Secondary | ICD-10-CM

## 2021-06-10 DIAGNOSIS — I10 Essential (primary) hypertension: Secondary | ICD-10-CM

## 2021-06-10 LAB — POCT INR: INR: 2.5 (ref 2.0–3.0)

## 2021-06-10 NOTE — Patient Instructions (Signed)
Medication Instructions:  Your physician recommends that you continue on your current medications as directed. Please refer to the Current Medication list given to you today.  *If you need a refill on your cardiac medications before your next appointment, please call your pharmacy*  Follow-Up: At [facility], you and your health needs are our priority.  As part of our continuing mission to provide you with exceptional heart care, we have created designated Provider Care Teams.  These Care Teams include your primary Cardiologist (physician) and Advanced Practice Providers (APPs -  Physician Assistants and Nurse Practitioners) who all work together to provide you with the care you need, when you need it.  We recommend signing up for the patient portal called "MyChart".  Sign up information is provided on this After Visit Summary.  MyChart is used to connect with patients for Virtual Visits (Telemedicine).  Patients are able to view lab/test results, encounter notes, upcoming appointments, etc.  Non-urgent messages can be sent to your provider as well.   To learn more about what you can do with MyChart, go to [email or web address].    Your next appointment:    1 year visit with Dr. Debara Pickett

## 2021-06-10 NOTE — Progress Notes (Signed)
OFFICE NOTE  Chief Complaint:  Follow-up  Primary Care Physician: Deland Pretty, MD  HPI:  Gerald Frey is a 76 year old gentleman who I am following for history of chronic A-fib on Coumadin, hypertension, dyslipidemia. Overall, again doing fairly well without any new concerns today. He denies any shortness of breath, palpitations, presyncope, or syncopal symptoms. INR in the office today was 3.0 which is stable. This is a history of coronary disease and had a stent to the LAD in 2005.  He has no anginal complaints. Recently he has missed some of his medications and he can definitely tell he feels worse, probably because of RVR.  However, he has been chopping wood without difficulty.  I saw Gerald Frey back in the office today. He is again without complaints. He denies any chest pain or worsening shortness of breath. He remains in chronic atrial fibrillation with an INR of 2.8 in the office. He's been very stable we discussed possibly changing to a direct oral anticoagulant before, but he prefers to stay on warfarin.  04/01/2016  Gerald Frey returns today for follow-up. He reports feeling well, although has gained weight over the last year. He denies any chest pain or worsening shortness of breath. His INR checked today was 2.9. It's been very stable. We again discussed possibly switching to a novel oral anticoagulant, however he prefers to stay on warfarin. Since he's been very well controlled, it's difficult to argue that there would be additional benefit from a NOAC. He's not had any significant bleeding complications. His last LV assessment was in 2009 when EF was 45-50% at that time. He is overdue for cholesterol testing as well.  04/03/2017  Gerald Frey was seen today in follow-up.  Is been a year since we last saw him.  He has been working on weight loss and is 1 pound lighter than we saw him last year which was lower than his previous weight.  He denies any chest pain  or worsening shortness of breath.  Blood pressure is well controlled today.  He is in long-standing persistent if not permanent atrial fibrillation.  He wishes to remain on warfarin and has had very stable INRs.  Ventricular rate is well controlled at 79 today.  LVEF normalized on his last echo in January 2018.  01/02/2018  Gerald Frey returns today for follow-up.  Recently he was seen in the A. fib clinic by Roderic Palau.  He was having symptoms concerning for chest pain.  Subsequently underwent an echocardiogram and stress test.  The stress test was negative for ischemia however showed mildly reduced LV function, but this was likely gating artifact as his echo showed normal systolic function.  He did report that he had some dizziness and felt somewhat dehydrated after working out in the yard.  He wondered if this might of provoked his A. fib.  This did improve with cooling down and drinking some Powerade.  He says since that episode a few months ago he is done very well and has had no further episodes.  He does get occasionally dizzy when he bends over to pick things up and stands up too quickly.  Of note he is on tamsulosin as well.  Most recent lipid profile showed LDL near goal at 71.  03/10/2021  Gerald Frey is seen today for follow-up.  I last saw him in 2019 however he has been followed up recently by [name], PA-C.  Overall he seems to be doing fairly well.  He  is also followed closely in our Coumadin clinic.  He has been on warfarin for some time for atrial fibrillation.  Blood pressure is well controlled today 116/76.  EKG shows A. fib which is likely permanent and rate controlled at 82.  Recently reports has been having some stomach issues.  He feels it might be worsened by aspirin and is inquiring about stopping that.  We also discussed the possibility of him switching off of warfarin to a NOAC, but he was still hesitant.  06/10/2021  Gerald Frey returns today for follow-up.   Recently he injured himself carrying a piece of plywood.  He had apparently developed a hematoma and was seen by orthopedic surgery they did x-rays which showed no fracture.  He then came to our office for evaluation.  Apparently was seen by our nurse supervisor, who evaluated him and recommended Coumadin clinic follow-up.  Please see this note in the chart.  Today he returns as he wishes to have his bruise evaluated.  I did examine it and shows that it is nearly resolved in the left thigh area.  Otherwise he continues on warfarin due primarily to cost issues.  Blood pressure is well controlled.  No chest pain or shortness of breath.  No awareness of A-fib which is rate controlled and permanent.  PMHx:  Past Medical History:  Diagnosis Date   Atrial fibrillation ([place]) 06/19/2007   2D Echo EF=45-50%   CAD (coronary artery disease) 2005   Stents.   Dyslipidemia    Hypertension    Palpitation     Past Surgical History:  Procedure Laterality Date   APPENDECTOMY  01/2000   CARDIAC CATHETERIZATION  03/18/2004   CYPHER 3.5x84mm stent in the proximal LAD   COLONOSCOPY  07/2004   hx colon polyps   KNEE CARTILAGE SURGERY  11/2002    FAMHx:  Family History  Problem Relation Age of Onset   Emphysema Mother 21   Heart failure Father    Alzheimer's disease Father 67    SOCHx:   reports that he has never smoked. He has never used smokeless tobacco. He reports that he does not drink alcohol and does not use drugs.  ALLERGIES:  No Known Allergies  ROS: Pertinent items noted in HPI and remainder of comprehensive ROS otherwise negative.  HOME MEDS: Current Outpatient Medications  Medication Sig Dispense Refill   cetirizine (ZYRTEC) 10 MG tablet Take 10 mg by mouth as needed.      diltiazem (CARDIZEM CD) 120 MG 24 hr capsule TAKE 1 CAPSULE BY MOUTH EVERY DAY 90 capsule 3   fish oil-omega-3 fatty acids 1000 MG capsule Take 1 g by mouth 2 (two) times daily.      metoprolol tartrate  (LOPRESSOR) 50 MG tablet Take 1 tablet (50 mg total) by mouth 2 (two) times daily. 180 tablet 1   omeprazole (PRILOSEC) 20 MG capsule Take by mouth.     rosuvastatin (CRESTOR) 20 MG tablet Take 1 tablet (20 mg total) by mouth daily. 90 tablet 0   tamsulosin (FLOMAX) 0.4 MG CAPS capsule Take 0.4 mg by mouth daily after supper.     warfarin (COUMADIN) 5 MG tablet TAKE 1 TABLET BY MOUTH EVERY DAY OR AS DIRECTED BY COUMADIN CLINIC 90 tablet 1   No current facility-administered medications for this visit.    LABS/IMAGING: No results found for this or any previous visit (from the past 48 hour(s)). No results found.  VITALS: BP 110/66    Pulse 62  Ht 6\' 3"  (1.905 m)    Wt 258 lb (117 kg)    SpO2 98%    BMI 32.25 kg/m   EXAM: General appearance: alert and no distress Neck: no adenopathy, no carotid bruit, no JVD, supple, symmetrical, trachea midline, and thyroid not enlarged, symmetric, no tenderness/mass/nodules Lungs: clear to auscultation bilaterally Heart: irregularly irregular rhythm Abdomen: soft, non-tender; bowel sounds normal; no masses,  no organomegaly Extremities: extremities normal, atraumatic, no cyanosis or edema Pulses: 2+ and symmetric Skin: Skin color, texture, turgor normal. No rashes or lesions Neurologic: Grossly normal  EKG: A. fib at 62-personally reviewed  ASSESSMENT: Coronary artery disease status post PCI of the LAD 2005 (low risk myoview 10/2017) Permanent atrial fibrillation on warfarin - CHADSVASC 2 (HTN, CHF) on warfarin History of ischemic cardiomyopathy, EF 45-50% (2009), up to 55-60% (2019) Hypertension Dyslipidemia  PLAN: 1.   Mr. Crull recently injured his left thigh area and developed hematoma.  This was evaluated by orthopedics.  It is nearly resolved at this point.  He continues to follow in the Coumadin clinic for INR checks.  He is in A-fib which is rate controlled.  No changes to his medicines today.  Plan otherwise follow-up annually or  sooner as necessary.  Pixie Casino, MD, [facility], [place] Director of the Advanced Lipid Disorders &  Cardiovascular Risk Reduction Clinic Attending Cardiologist  Direct Dial: [phone]   Fax: [phone]  Website:  [email or web address] 06/10/2021, 3:50 PM

## 2021-06-10 NOTE — Patient Instructions (Signed)
Continue taking 5mg  daily, except 2.5 mg on Wednesday and repeat INR in 6 weeks. Coumadin clinic [phone]

## 2021-06-14 DIAGNOSIS — I482 Chronic atrial fibrillation, unspecified: Secondary | ICD-10-CM | POA: Diagnosis not present

## 2021-06-14 DIAGNOSIS — Z7901 Long term (current) use of anticoagulants: Secondary | ICD-10-CM | POA: Diagnosis not present

## 2021-06-14 DIAGNOSIS — K219 Gastro-esophageal reflux disease without esophagitis: Secondary | ICD-10-CM | POA: Diagnosis not present

## 2021-06-14 DIAGNOSIS — R7303 Prediabetes: Secondary | ICD-10-CM | POA: Diagnosis not present

## 2021-06-14 DIAGNOSIS — I2510 Atherosclerotic heart disease of native coronary artery without angina pectoris: Secondary | ICD-10-CM | POA: Diagnosis not present

## 2021-06-14 DIAGNOSIS — E7800 Pure hypercholesterolemia, unspecified: Secondary | ICD-10-CM | POA: Diagnosis not present

## 2021-06-14 DIAGNOSIS — I10 Essential (primary) hypertension: Secondary | ICD-10-CM | POA: Diagnosis not present

## 2021-06-18 ENCOUNTER — Other Ambulatory Visit: Payer: Self-pay | Admitting: Internal Medicine

## 2021-06-18 DIAGNOSIS — R7303 Prediabetes: Secondary | ICD-10-CM | POA: Diagnosis not present

## 2021-06-22 DIAGNOSIS — K5900 Constipation, unspecified: Secondary | ICD-10-CM | POA: Diagnosis not present

## 2021-06-22 DIAGNOSIS — R140 Abdominal distension (gaseous): Secondary | ICD-10-CM | POA: Diagnosis not present

## 2021-06-22 DIAGNOSIS — K219 Gastro-esophageal reflux disease without esophagitis: Secondary | ICD-10-CM | POA: Diagnosis not present

## 2021-06-23 DIAGNOSIS — I10 Essential (primary) hypertension: Secondary | ICD-10-CM | POA: Diagnosis not present

## 2021-06-23 DIAGNOSIS — I482 Chronic atrial fibrillation, unspecified: Secondary | ICD-10-CM | POA: Diagnosis not present

## 2021-06-23 DIAGNOSIS — N183 Chronic kidney disease, stage 3 unspecified: Secondary | ICD-10-CM | POA: Diagnosis not present

## 2021-06-23 DIAGNOSIS — E7800 Pure hypercholesterolemia, unspecified: Secondary | ICD-10-CM | POA: Diagnosis not present

## 2021-06-23 DIAGNOSIS — K5900 Constipation, unspecified: Secondary | ICD-10-CM | POA: Diagnosis not present

## 2021-06-23 DIAGNOSIS — R7303 Prediabetes: Secondary | ICD-10-CM | POA: Diagnosis not present

## 2021-07-09 ENCOUNTER — Ambulatory Visit: Payer: Medicare PPO | Admitting: Internal Medicine

## 2021-07-22 ENCOUNTER — Ambulatory Visit (INDEPENDENT_AMBULATORY_CARE_PROVIDER_SITE_OTHER): Payer: Medicare PPO

## 2021-07-22 DIAGNOSIS — Z7901 Long term (current) use of anticoagulants: Secondary | ICD-10-CM

## 2021-07-22 DIAGNOSIS — I4891 Unspecified atrial fibrillation: Secondary | ICD-10-CM | POA: Diagnosis not present

## 2021-07-22 DIAGNOSIS — Z5181 Encounter for therapeutic drug level monitoring: Secondary | ICD-10-CM | POA: Diagnosis not present

## 2021-07-22 LAB — POCT INR: INR: 2.2 (ref 2.0–3.0)

## 2021-07-22 NOTE — Patient Instructions (Signed)
Continue taking '5mg'$  daily, except 2.5 mg on Wednesday and repeat INR in 6 weeks. Coumadin clinic [phone] ?

## 2021-08-13 ENCOUNTER — Other Ambulatory Visit: Payer: Self-pay | Admitting: Internal Medicine

## 2021-08-16 ENCOUNTER — Other Ambulatory Visit: Payer: Self-pay | Admitting: Internal Medicine

## 2021-09-02 ENCOUNTER — Ambulatory Visit (INDEPENDENT_AMBULATORY_CARE_PROVIDER_SITE_OTHER): Payer: Medicare PPO

## 2021-09-02 DIAGNOSIS — I4891 Unspecified atrial fibrillation: Secondary | ICD-10-CM

## 2021-09-02 DIAGNOSIS — Z7901 Long term (current) use of anticoagulants: Secondary | ICD-10-CM

## 2021-09-02 DIAGNOSIS — Z5181 Encounter for therapeutic drug level monitoring: Secondary | ICD-10-CM | POA: Diagnosis not present

## 2021-09-02 LAB — POCT INR: INR: 1.8 — AB (ref 2.0–3.0)

## 2021-09-02 NOTE — Patient Instructions (Signed)
TAKE 2 TABLETS TODAY ONLY and then Continue taking '5mg'$  daily, except 2.5 mg on Wednesday and repeat INR in 6 weeks. Coumadin clinic [phone]

## 2021-10-14 ENCOUNTER — Ambulatory Visit: Payer: Medicare PPO

## 2021-10-14 DIAGNOSIS — I4891 Unspecified atrial fibrillation: Secondary | ICD-10-CM

## 2021-10-14 DIAGNOSIS — Z7901 Long term (current) use of anticoagulants: Secondary | ICD-10-CM

## 2021-10-14 LAB — POCT INR: INR: 1.9 — AB (ref 2.0–3.0)

## 2021-10-14 MED ORDER — RIVAROXABAN 20 MG PO TABS: 20.0000 mg | ORAL_TABLET | Freq: Every day | ORAL | 1 refills | Status: DC

## 2021-10-14 NOTE — Patient Instructions (Signed)
Description   START taking Xarelto tonight.  Coumadin clinic [phone]

## 2021-10-14 NOTE — Progress Notes (Signed)
Switching from Warfarin to Xarelto  Indication: Afib  Last office visit: 06/10/21 Weight: 117kg Age: 76 Scr: 1.16 (06/18/21)  CrCl: 89.42m/min  Appropriate dose per CKarren Cobble PharmD.  Refill sent to requested pharmacy.

## 2021-11-10 ENCOUNTER — Other Ambulatory Visit: Payer: Self-pay | Admitting: Internal Medicine

## 2021-11-11 DIAGNOSIS — Z961 Presence of intraocular lens: Secondary | ICD-10-CM | POA: Diagnosis not present

## 2021-11-11 DIAGNOSIS — H43813 Vitreous degeneration, bilateral: Secondary | ICD-10-CM | POA: Diagnosis not present

## 2021-11-11 DIAGNOSIS — H35373 Puckering of macula, bilateral: Secondary | ICD-10-CM | POA: Diagnosis not present

## 2021-11-24 DIAGNOSIS — M1990 Unspecified osteoarthritis, unspecified site: Secondary | ICD-10-CM | POA: Diagnosis not present

## 2021-11-24 DIAGNOSIS — N400 Enlarged prostate without lower urinary tract symptoms: Secondary | ICD-10-CM | POA: Diagnosis not present

## 2021-11-24 DIAGNOSIS — I2510 Atherosclerotic heart disease of native coronary artery without angina pectoris: Secondary | ICD-10-CM | POA: Diagnosis not present

## 2021-11-24 DIAGNOSIS — D6869 Other thrombophilia: Secondary | ICD-10-CM | POA: Diagnosis not present

## 2021-11-24 DIAGNOSIS — I4891 Unspecified atrial fibrillation: Secondary | ICD-10-CM | POA: Diagnosis not present

## 2021-11-24 DIAGNOSIS — E785 Hyperlipidemia, unspecified: Secondary | ICD-10-CM | POA: Diagnosis not present

## 2021-11-24 DIAGNOSIS — I951 Orthostatic hypotension: Secondary | ICD-10-CM | POA: Diagnosis not present

## 2021-11-24 DIAGNOSIS — E669 Obesity, unspecified: Secondary | ICD-10-CM | POA: Diagnosis not present

## 2021-11-24 DIAGNOSIS — I10 Essential (primary) hypertension: Secondary | ICD-10-CM | POA: Diagnosis not present

## 2021-12-01 DIAGNOSIS — H9221 Otorrhagia, right ear: Secondary | ICD-10-CM | POA: Diagnosis not present

## 2021-12-01 DIAGNOSIS — S00451A Superficial foreign body of right ear, initial encounter: Secondary | ICD-10-CM | POA: Diagnosis not present

## 2021-12-09 DIAGNOSIS — R7303 Prediabetes: Secondary | ICD-10-CM | POA: Diagnosis not present

## 2021-12-09 DIAGNOSIS — Z0000 Encounter for general adult medical examination without abnormal findings: Secondary | ICD-10-CM | POA: Diagnosis not present

## 2021-12-09 DIAGNOSIS — Z125 Encounter for screening for malignant neoplasm of prostate: Secondary | ICD-10-CM | POA: Diagnosis not present

## 2021-12-09 DIAGNOSIS — E7801 Familial hypercholesterolemia: Secondary | ICD-10-CM | POA: Diagnosis not present

## 2021-12-14 DIAGNOSIS — E669 Obesity, unspecified: Secondary | ICD-10-CM | POA: Diagnosis not present

## 2021-12-14 DIAGNOSIS — N1831 Chronic kidney disease, stage 3a: Secondary | ICD-10-CM | POA: Diagnosis not present

## 2021-12-14 DIAGNOSIS — R7303 Prediabetes: Secondary | ICD-10-CM | POA: Diagnosis not present

## 2021-12-14 DIAGNOSIS — I10 Essential (primary) hypertension: Secondary | ICD-10-CM | POA: Diagnosis not present

## 2021-12-14 DIAGNOSIS — Z7901 Long term (current) use of anticoagulants: Secondary | ICD-10-CM | POA: Diagnosis not present

## 2021-12-14 DIAGNOSIS — I482 Chronic atrial fibrillation, unspecified: Secondary | ICD-10-CM | POA: Diagnosis not present

## 2021-12-14 DIAGNOSIS — Z0000 Encounter for general adult medical examination without abnormal findings: Secondary | ICD-10-CM | POA: Diagnosis not present

## 2021-12-14 DIAGNOSIS — E7800 Pure hypercholesterolemia, unspecified: Secondary | ICD-10-CM | POA: Diagnosis not present

## 2022-01-08 ENCOUNTER — Other Ambulatory Visit: Payer: Self-pay | Admitting: Internal Medicine

## 2022-01-14 ENCOUNTER — Encounter (HOSPITAL_BASED_OUTPATIENT_CLINIC_OR_DEPARTMENT_OTHER): Payer: Self-pay | Admitting: Internal Medicine

## 2022-02-20 IMAGING — DX DG KNEE COMPLETE 4+V*L*
4 series · 4 of 4 positions shown · non-contrast
Comparison: None.

CLINICAL DATA: Left knee pain following fall, initial encounter

EXAM:
LEFT KNEE - COMPLETE 4+ VIEW

[t knee ap left]
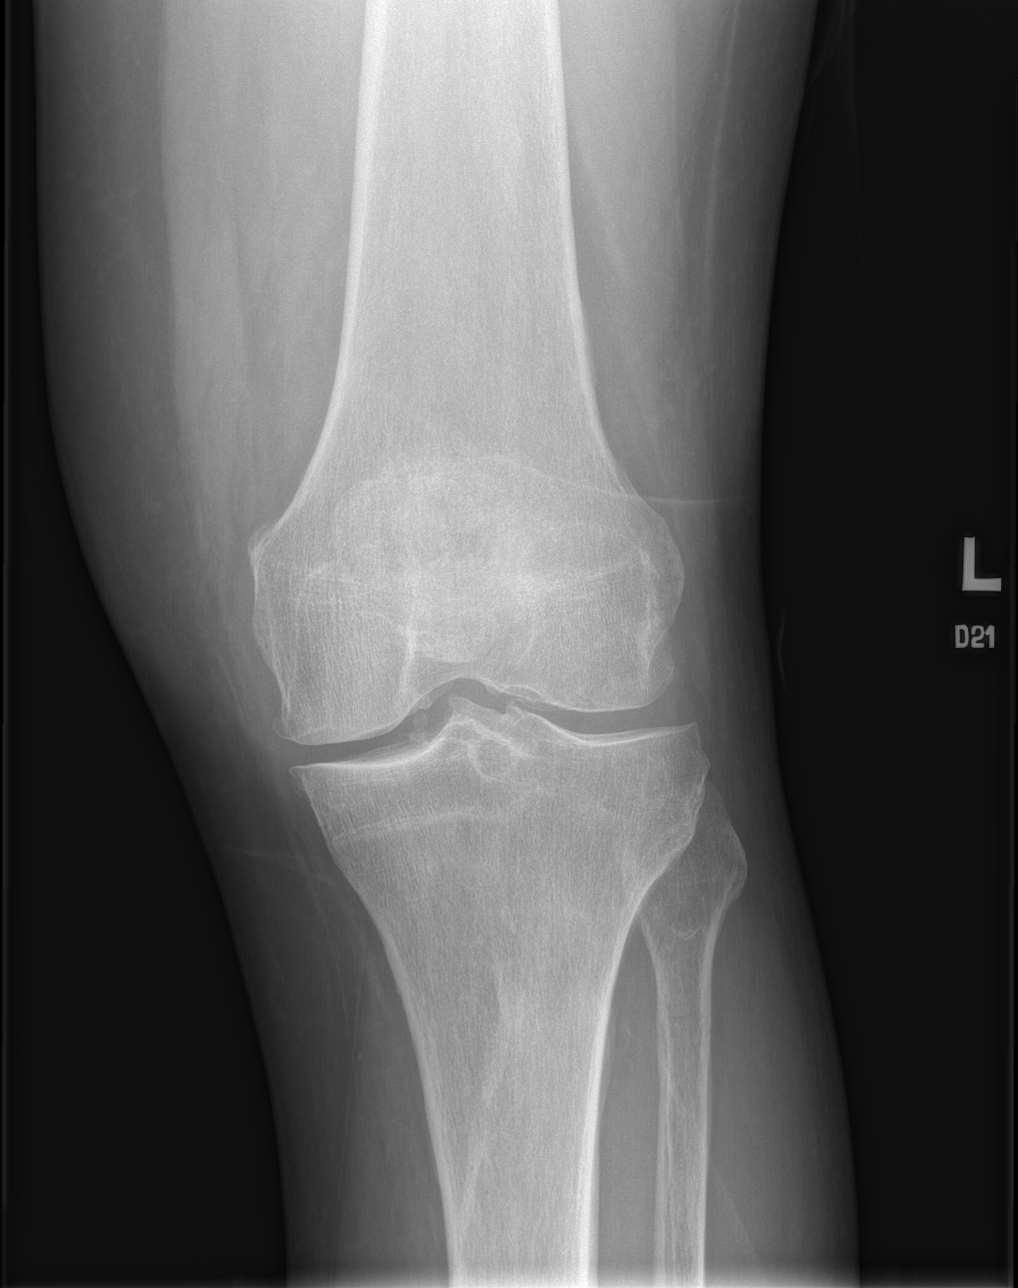

[t knee obl left (1 of 2)]
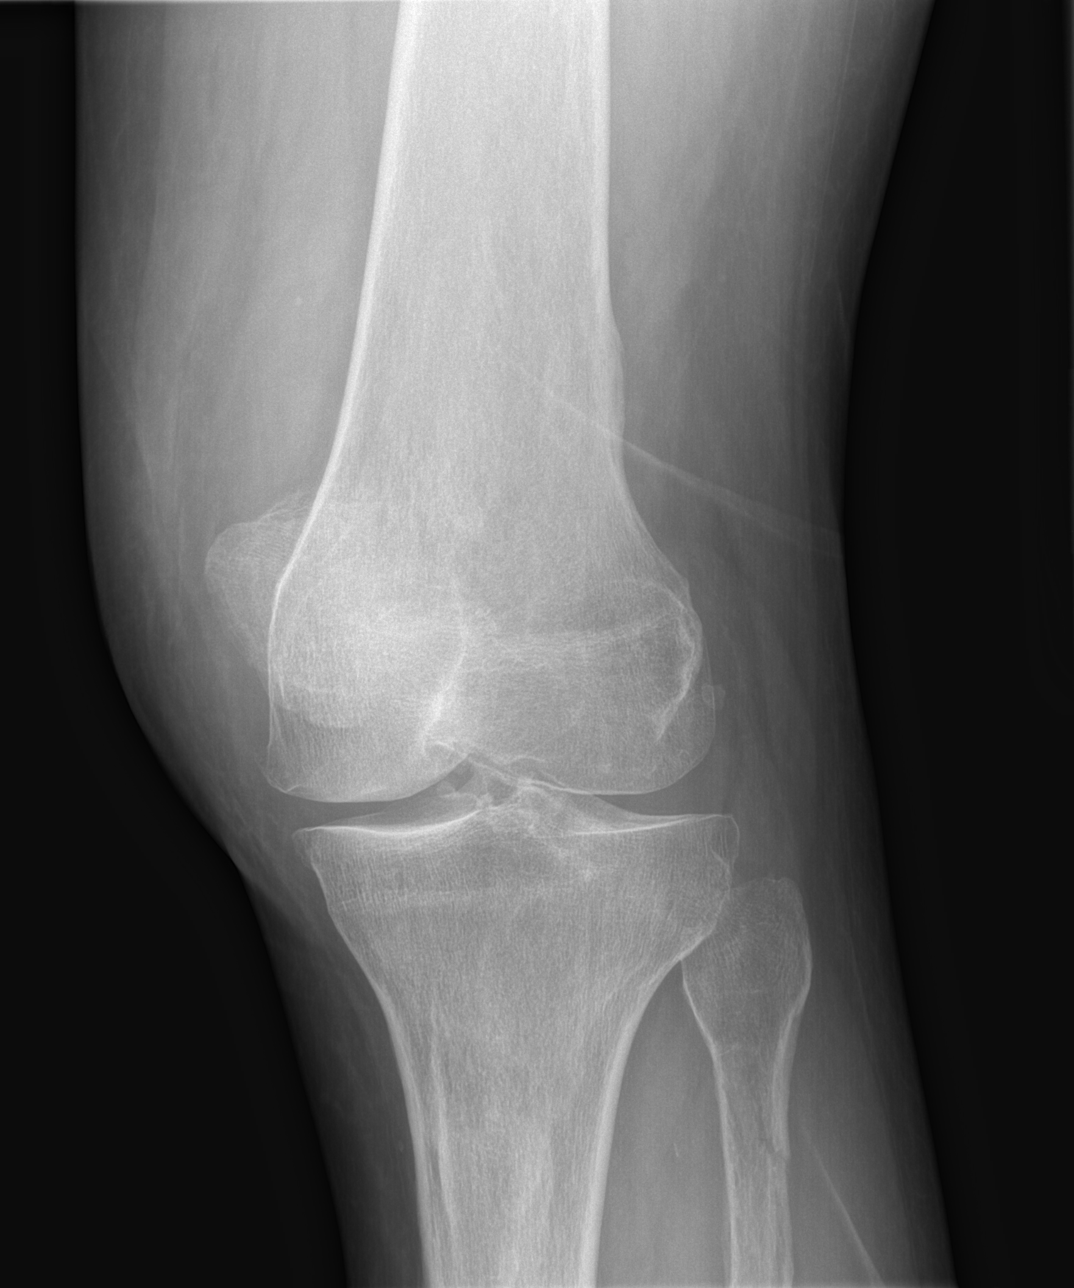

[t knee obl left (2 of 2)]
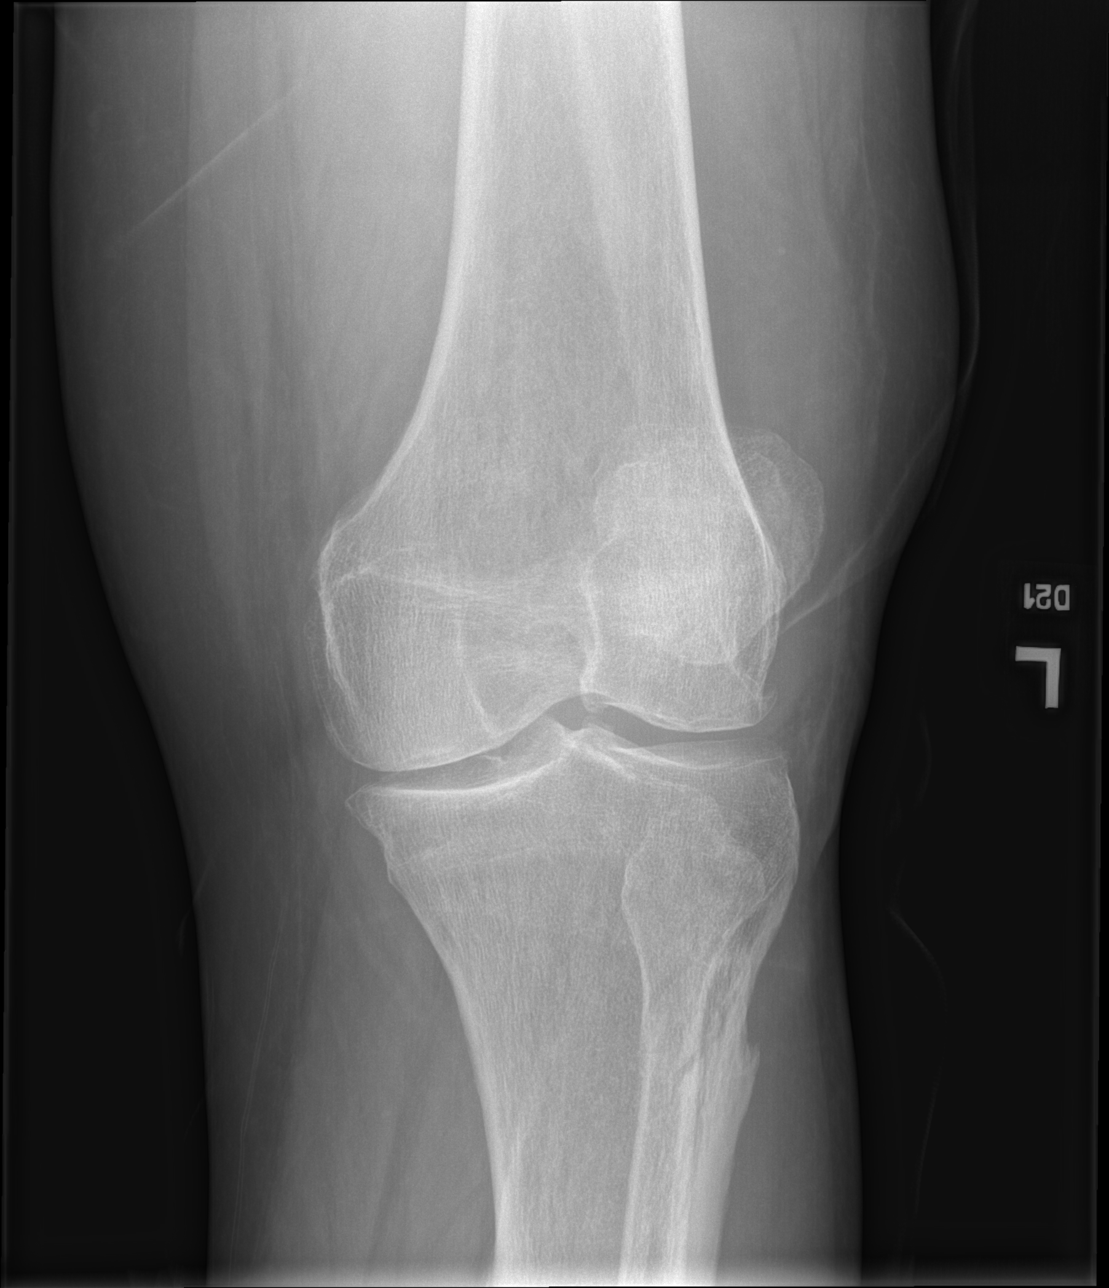

[t knee lat left]
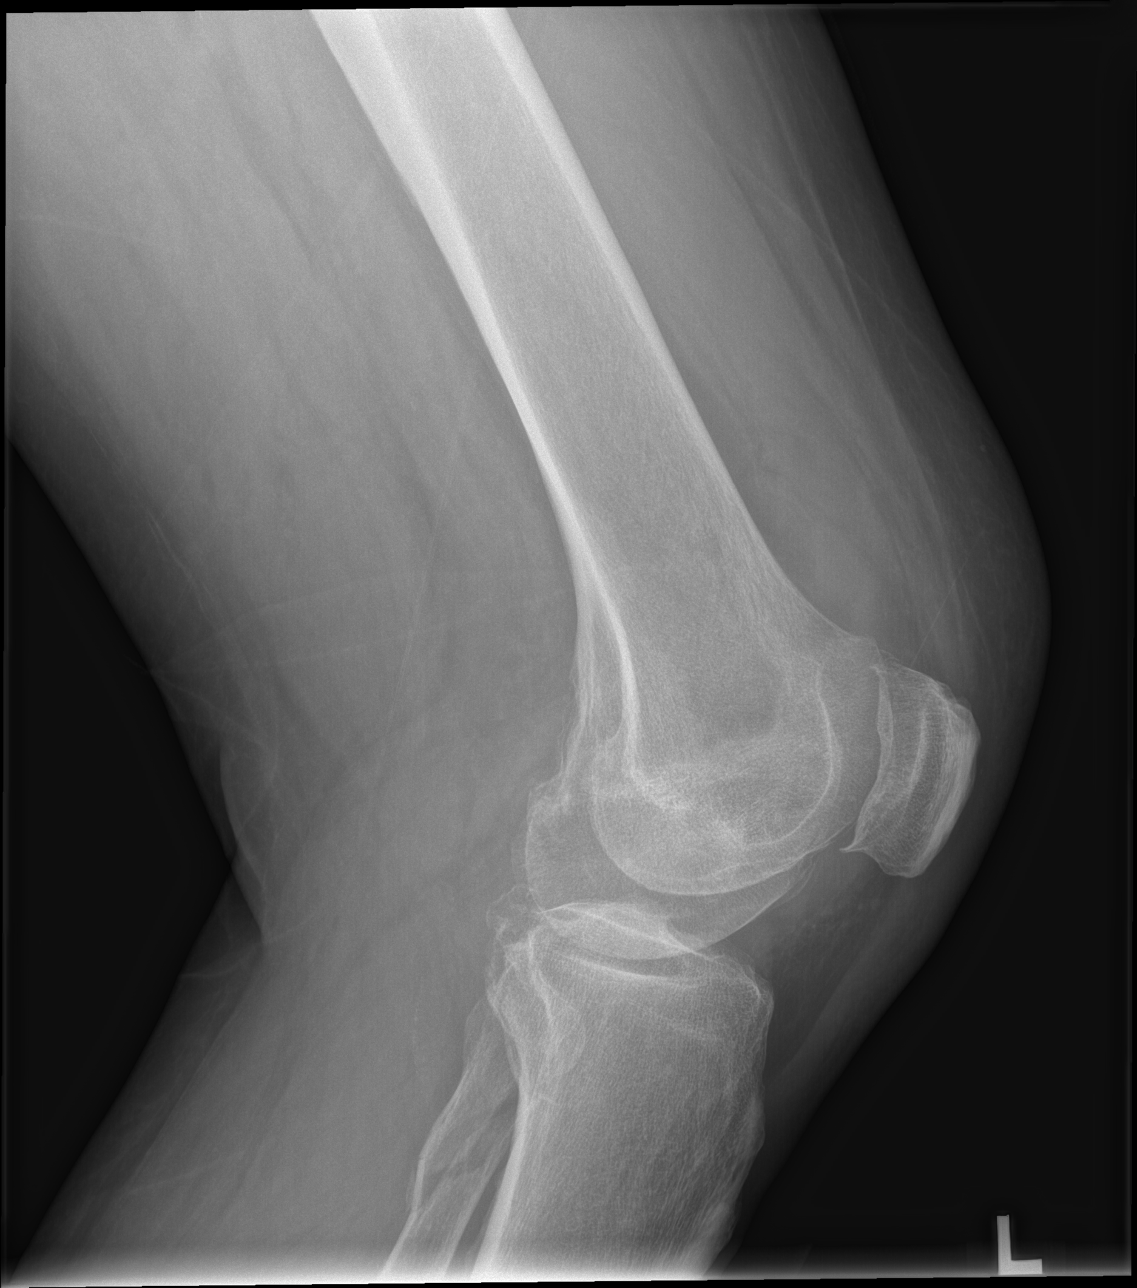

[4 of 4 positions shown; findings below may reference images not displayed]

FINDINGS: Minimally displaced proximal fibular neck fracture is noted. No
other fracture is seen. No soft tissue abnormality is noted.
Degenerative changes of the knee joint are seen.
IMPRESSION: Degenerative changes in the knee joint.

Proximal fibular fracture.

## 2022-02-22 DIAGNOSIS — Z85828 Personal history of other malignant neoplasm of skin: Secondary | ICD-10-CM | POA: Diagnosis not present

## 2022-02-22 DIAGNOSIS — L578 Other skin changes due to chronic exposure to nonionizing radiation: Secondary | ICD-10-CM | POA: Diagnosis not present

## 2022-02-22 DIAGNOSIS — Z86018 Personal history of other benign neoplasm: Secondary | ICD-10-CM | POA: Diagnosis not present

## 2022-02-22 DIAGNOSIS — D0359 Melanoma in situ of other part of trunk: Secondary | ICD-10-CM | POA: Diagnosis not present

## 2022-02-22 DIAGNOSIS — D485 Neoplasm of uncertain behavior of skin: Secondary | ICD-10-CM | POA: Diagnosis not present

## 2022-02-22 DIAGNOSIS — L821 Other seborrheic keratosis: Secondary | ICD-10-CM | POA: Diagnosis not present

## 2022-02-22 DIAGNOSIS — D2222 Melanocytic nevi of left ear and external auricular canal: Secondary | ICD-10-CM | POA: Diagnosis not present

## 2022-02-22 DIAGNOSIS — L570 Actinic keratosis: Secondary | ICD-10-CM | POA: Diagnosis not present

## 2022-02-22 DIAGNOSIS — D225 Melanocytic nevi of trunk: Secondary | ICD-10-CM | POA: Diagnosis not present

## 2022-02-22 DIAGNOSIS — L814 Other melanin hyperpigmentation: Secondary | ICD-10-CM | POA: Diagnosis not present

## 2022-03-07 DIAGNOSIS — D0359 Melanoma in situ of other part of trunk: Secondary | ICD-10-CM | POA: Diagnosis not present

## 2022-04-07 ENCOUNTER — Other Ambulatory Visit: Payer: Self-pay | Admitting: Internal Medicine

## 2022-04-07 DIAGNOSIS — I4891 Unspecified atrial fibrillation: Secondary | ICD-10-CM

## 2022-04-07 DIAGNOSIS — Z7901 Long term (current) use of anticoagulants: Secondary | ICD-10-CM

## 2022-04-22 DIAGNOSIS — M79645 Pain in left finger(s): Secondary | ICD-10-CM | POA: Diagnosis not present

## 2022-04-22 DIAGNOSIS — M1990 Unspecified osteoarthritis, unspecified site: Secondary | ICD-10-CM | POA: Diagnosis not present

## 2022-04-22 DIAGNOSIS — M79642 Pain in left hand: Secondary | ICD-10-CM | POA: Diagnosis not present

## 2022-04-22 DIAGNOSIS — M6530 Trigger finger, unspecified finger: Secondary | ICD-10-CM | POA: Diagnosis not present

## 2022-06-12 ENCOUNTER — Other Ambulatory Visit: Payer: Self-pay | Admitting: Internal Medicine

## 2022-06-16 DIAGNOSIS — R7303 Prediabetes: Secondary | ICD-10-CM | POA: Diagnosis not present

## 2022-06-16 DIAGNOSIS — E7800 Pure hypercholesterolemia, unspecified: Secondary | ICD-10-CM | POA: Diagnosis not present

## 2022-06-16 DIAGNOSIS — I10 Essential (primary) hypertension: Secondary | ICD-10-CM | POA: Diagnosis not present

## 2022-06-16 DIAGNOSIS — I2510 Atherosclerotic heart disease of native coronary artery without angina pectoris: Secondary | ICD-10-CM | POA: Diagnosis not present

## 2022-06-16 DIAGNOSIS — E669 Obesity, unspecified: Secondary | ICD-10-CM | POA: Diagnosis not present

## 2022-06-16 DIAGNOSIS — I482 Chronic atrial fibrillation, unspecified: Secondary | ICD-10-CM | POA: Diagnosis not present

## 2022-07-01 ENCOUNTER — Other Ambulatory Visit: Payer: Self-pay | Admitting: Internal Medicine

## 2022-08-02 ENCOUNTER — Other Ambulatory Visit: Payer: Self-pay | Admitting: Internal Medicine

## 2022-08-09 ENCOUNTER — Encounter: Payer: Self-pay | Admitting: Physician Assistant

## 2022-08-09 ENCOUNTER — Ambulatory Visit: Payer: Medicare PPO | Attending: Physician Assistant | Admitting: Physician Assistant

## 2022-08-09 VITALS — BP 122/66 | HR 71 | Ht 75.0 in | Wt 259.6 lb

## 2022-08-09 DIAGNOSIS — I10 Essential (primary) hypertension: Secondary | ICD-10-CM

## 2022-08-09 DIAGNOSIS — I2510 Atherosclerotic heart disease of native coronary artery without angina pectoris: Secondary | ICD-10-CM | POA: Diagnosis not present

## 2022-08-09 DIAGNOSIS — Z7901 Long term (current) use of anticoagulants: Secondary | ICD-10-CM | POA: Diagnosis not present

## 2022-08-09 DIAGNOSIS — E785 Hyperlipidemia, unspecified: Secondary | ICD-10-CM | POA: Diagnosis not present

## 2022-08-09 DIAGNOSIS — I4891 Unspecified atrial fibrillation: Secondary | ICD-10-CM

## 2022-08-09 DIAGNOSIS — I4821 Permanent atrial fibrillation: Secondary | ICD-10-CM

## 2022-08-09 MED ORDER — DILTIAZEM HCL ER COATED BEADS 120 MG PO CP24: ORAL_CAPSULE | ORAL | 3 refills | Status: DC

## 2022-08-09 MED ORDER — METOPROLOL TARTRATE 50 MG PO TABS: 50.0000 mg | ORAL_TABLET | Freq: Two times a day (BID) | ORAL | 3 refills | Status: DC

## 2022-08-09 MED ORDER — ROSUVASTATIN CALCIUM 20 MG PO TABS: 20.0000 mg | ORAL_TABLET | Freq: Every day | ORAL | 3 refills | Status: DC

## 2022-08-09 MED ORDER — RIVAROXABAN 20 MG PO TABS: 20.0000 mg | ORAL_TABLET | Freq: Every day | ORAL | 3 refills | Status: DC

## 2022-08-09 NOTE — Progress Notes (Unsigned)
Cardiology Office Note:    Date:  08/10/2022   ID:  Gerald Frey, DOB 24-Aug-1945, MRN [id number]  PCP:  Merri Brunette, MD   [facility] Providers Cardiologist:  Chrystie Nose, MD =    Referring MD: Merri Brunette, MD   Chief Complaint  Patient presents with   Follow-up    Seen for Dr. Rennis Frey    History of Present Illness:    Gerald Frey is a 77 y.o. male with a hx of hypertension, hyperlipidemia, permanent A-fib on Coumadin and CAD s/p stent to LAD 2005.  EF was 45 to 50% in 2009.  EF normalized on repeat echocardiogram in January 2018.  Last echocardiogram obtained in July 2019 demonstrated EF 55 to 60%, mild focal basal hypertrophy of septum, mild LAE.  Myoview obtained on 11/14/2017 demonstrated EF 48%, no reversible ischemia was seen.  Patient was last seen by Dr. Rennis Frey in February 2023 at which time he injured his left thigh and developed a hematoma.  By the time he was evaluated, his hematoma has largely resolved.  Patient returned for 1 year follow-up.  He denies any chest pain.  He has no exertional shortness of breath with everyday activity but does feel short of breath with more strenuous activity.  He is not very active.  Heart rate is very well-controlled with metoprolol and diltiazem.  He is on Xarelto and has not noticed any blood in the stool or blood in the urine.  Last lipid panel in EPIC system was dating back to 2018, annual lipid panel has been followed by Dr. Renne Frey.  I will refill his medications.  EKG does show minimal T wave inversion in the lateral leads, this is new when compared to 2023 EKG however was present on the November 2022 EKG.  We discussed monitoring for anginal symptoms such as worsening shortness of breath or worsening chest discomfort.  She does have mild orthostatic dizziness for several weeks earlier this year, this has resolved.  We discussed pathophysiology behind orthostatic dizziness.  I encouraged fluid intake in the  morning and use caution when getting up.  If symptom worsen, he may need compression stocking.  Otherwise, he can follow-up in 1 year.   Past Medical History:  Diagnosis Date   Atrial fibrillation 06/19/2007   2D Echo EF=45-50%   CAD (coronary artery disease) 2005   Stents.   Dyslipidemia    Hypertension    Palpitation     Past Surgical History:  Procedure Laterality Date   APPENDECTOMY  01/2000   CARDIAC CATHETERIZATION  03/18/2004   CYPHER 3.5x23mm stent in the proximal LAD   COLONOSCOPY  07/2004   hx colon polyps   KNEE CARTILAGE SURGERY  11/2002    Current Medications: Current Meds  Medication Sig   cetirizine (ZYRTEC) 10 MG tablet Take 10 mg by mouth as needed.    fish oil-omega-3 fatty acids 1000 MG capsule Take 1 g by mouth 2 (two) times daily.    tamsulosin (FLOMAX) 0.4 MG CAPS capsule Take 0.4 mg by mouth daily after supper.   [DISCONTINUED] diltiazem (CARDIZEM CD) 120 MG 24 hr capsule TAKE 1 CAPSULE BY MOUTH EVERY DAY. NEEDS APPOINTMENT FOR FUTURE REFILLS.   [DISCONTINUED] metoprolol tartrate (LOPRESSOR) 50 MG tablet TAKE 1 TABLET BY MOUTH TWICE A DAY   [DISCONTINUED] rosuvastatin (CRESTOR) 20 MG tablet TAKE 1 TABLET BY MOUTH EVERY DAY   [DISCONTINUED] XARELTO 20 MG TABS tablet TAKE 1 TABLET BY MOUTH DAILY WITH SUPPER.  Allergies:   Patient has no known allergies.   Social History   Socioeconomic History   Marital status: Married    Spouse name: Not on file   Number of children: 1   Years of education: college   Highest education level: Not on file  Occupational History   Occupation: retired  Tobacco Use   Smoking status: Never   Smokeless tobacco: Never  Vaping Use   Vaping Use: Never used  Substance and Sexual Activity   Alcohol use: No   Drug use: No   Sexual activity: Not on file  Other Topics Concern   Not on file  Social History Narrative   Lives with wife in a one story home.  Has one son.  Retired Education administrator for [employer].      Education: college.    Social Determinants of Health   Financial Resource Strain: Not on file  Food Insecurity: Not on file  Transportation Needs: Not on file  Physical Activity: Not on file  Stress: Not on file  Social Connections: Not on file     Family History: The patient's family history includes Alzheimer's disease (age of onset: 57) in his father; Emphysema (age of onset: 71) in his mother; Heart failure in his father.  ROS:   Please see the history of present illness.     All other systems reviewed and are negative.  EKGs/Labs/Other Studies Reviewed:    The following studies were reviewed today:  Echo 11/13/2017 LV EF: 55% -   60%   -------------------------------------------------------------------  Indications:     Atrial fibrillation - 427.31.   -------------------------------------------------------------------  Study Conclusions   - Left ventricle: The cavity size was normal. There was mild focal    basal hypertrophy of the septum. Systolic function was normal.    The estimated ejection fraction was in the range of 55% to 60%.    Wall motion was normal; there were no regional wall motion    abnormalities.  - Ascending aorta: The ascending aorta was mildly dilated.  - Left atrium: The atrium was mildly dilated.   Impressions:   - Normal LV systolic function; mildly dilated aortic root; mild    LAE; mild TR.   EKG:  EKG is ordered today.  The ekg ordered today demonstrates atrial fibrillation, minimal T wave inversion in the lateral leads from V4-V6.  Recent Labs: No results found for requested labs within last 365 days.  Recent Lipid Panel    Component Value Date/Time   CHOL 137 04/06/2017 0832   TRIG 150 (H) 04/06/2017 0832   HDL 39 (L) 04/06/2017 0832   CHOLHDL 3.5 04/06/2017 0832   CHOLHDL 4.1 04/05/2016 0844   VLDL 30 04/05/2016 0844   LDLCALC 68 04/06/2017 0832     Risk Assessment/Calculations:    CHA2DS2-VASc Score = 5   This  indicates a 7.2% annual risk of stroke. The patient's score is based upon: CHF History: 1 HTN History: 1 Diabetes History: 0 Stroke History: 0 Vascular Disease History: 1 Age Score: 2 Gender Score: 0          Physical Exam:    VS:  BP 122/66 (BP Location: Left Arm, Patient Position: Sitting, Cuff Size: Large)   Pulse 71   Ht  (1.905 m)   Wt 259 lb 9.6 oz (117.8 kg)   SpO2 96%   BMI 32.45 kg/m         Wt Readings from Last 3 Encounters:  08/09/22 259 lb  9.6 oz (117.8 kg)  06/10/21 258 lb (117 kg)  03/10/21 261 lb (118.4 kg)     GEN:  Well nourished, well developed in no acute distress HEENT: Normal NECK: No JVD; No carotid bruits LYMPHATICS: No lymphadenopathy CARDIAC: Irregularly irregular, no murmurs, rubs, gallops RESPIRATORY:  Clear to auscultation without rales, wheezing or rhonchi  ABDOMEN: Soft, non-tender, non-distended MUSCULOSKELETAL:  No edema; No deformity  SKIN: Warm and dry NEUROLOGIC:  Alert and oriented x 3 PSYCHIATRIC:  Normal affect   ASSESSMENT:    1. Permanent atrial fibrillation   2. Long term current use of anticoagulant therapy   3. Coronary artery disease involving native coronary artery of native heart without angina pectoris   4. Essential hypertension   5. Hyperlipidemia LDL goal <70    PLAN:    In order of problems listed above:  Permanent atrial fibrillation: Continue diltiazem, metoprolol tartrate and Xarelto.  Long-term use of anticoagulation therapy: On Xarelto  CAD: Denies any recent chest pain  Hypertension: Blood pressure stable  Hyperlipidemia: On fish oil and Crestor.  Annual lipid panel followed by Dr. Renne Frey           Medication Adjustments/Labs and Tests Ordered: Current medicines are reviewed at length with the patient today.  Concerns regarding medicines are outlined above.  Orders Placed This Encounter  Procedures   EKG 12-Lead   Meds ordered this encounter  Medications   diltiazem (CARDIZEM  CD) 120 MG 24 hr capsule    Sig: TAKE 1 CAPSULE BY MOUTH EVERY DAY. NEEDS APPOINTMENT FOR FUTURE REFILLS.    Dispense:  90 capsule    Refill:  3    PLEASE SCHEDULE AN APPT FOR FUTURE REFILLS   metoprolol tartrate (LOPRESSOR) 50 MG tablet    Sig: Take 1 tablet (50 mg total) by mouth 2 (two) times daily.    Dispense:  180 tablet    Refill:  3    Patient needs to schedule an office visit for further refills.   rosuvastatin (CRESTOR) 20 MG tablet    Sig: Take 1 tablet (20 mg total) by mouth daily.    Dispense:  90 tablet    Refill:  3    Patient needs to schedule an office visit for further refills.   rivaroxaban (XARELTO) 20 MG TABS tablet    Sig: Take 1 tablet (20 mg total) by mouth daily with supper.    Dispense:  90 tablet    Refill:  3    Patient Instructions  Medication Instructions:  The current medical regimen is effective;  continue present plan and medications as directed. Please refer to the Current Medication list given to you today.  *If you need a refill on your cardiac medications before your next appointment, please call your pharmacy*   Lab Work: NONE If you have labs (blood work) drawn today and your tests are completely normal, you will receive your results only by: MyChart Message (if you have MyChart) OR A paper copy in the mail If you have any lab test that is abnormal or we need to change your treatment, we will call you to review the results.   Testing/Procedures: NONE   Follow-Up: At [facility], you and your health needs are our priority.  As part of our continuing mission to provide you with exceptional heart care, we have created designated Provider Care Teams.  These Care Teams include your primary Cardiologist (physician) and Advanced Practice Providers (APPs -  Physician Assistants and Nurse Practitioners)  who all work together to provide you with the care you need, when you need it.   Your next appointment:   12 month(s)  Provider:    Chrystie Nose, MD     Other Instructions     Signed, Azalee Course, PA  08/10/2022 11:03 PM    [facility]

## 2022-08-09 NOTE — Patient Instructions (Signed)
Medication Instructions:  The current medical regimen is effective;  continue present plan and medications as directed. Please refer to the Current Medication list given to you today.  *If you need a refill on your cardiac medications before your next appointment, please call your pharmacy*   Lab Work: NONE If you have labs (blood work) drawn today and your tests are completely normal, you will receive your results only by: MyChart Message (if you have MyChart) OR A paper copy in the mail If you have any lab test that is abnormal or we need to change your treatment, we will call you to review the results.   Testing/Procedures: NONE   Follow-Up: At [facility], you and your health needs are our priority.  As part of our continuing mission to provide you with exceptional heart care, we have created designated Provider Care Teams.  These Care Teams include your primary Cardiologist (physician) and Advanced Practice Providers (APPs -  Physician Assistants and Nurse Practitioners) who all work together to provide you with the care you need, when you need it.   Your next appointment:   12 month(s)  Provider:   Chrystie Nose, MD     Other Instructions

## 2022-08-10 ENCOUNTER — Encounter: Payer: Self-pay | Admitting: Physician Assistant

## 2022-08-22 DIAGNOSIS — D2262 Melanocytic nevi of left upper limb, including shoulder: Secondary | ICD-10-CM | POA: Diagnosis not present

## 2022-08-22 DIAGNOSIS — D225 Melanocytic nevi of trunk: Secondary | ICD-10-CM | POA: Diagnosis not present

## 2022-08-22 DIAGNOSIS — L814 Other melanin hyperpigmentation: Secondary | ICD-10-CM | POA: Diagnosis not present

## 2022-08-22 DIAGNOSIS — L578 Other skin changes due to chronic exposure to nonionizing radiation: Secondary | ICD-10-CM | POA: Diagnosis not present

## 2022-08-22 DIAGNOSIS — L820 Inflamed seborrheic keratosis: Secondary | ICD-10-CM | POA: Diagnosis not present

## 2022-08-22 DIAGNOSIS — L821 Other seborrheic keratosis: Secondary | ICD-10-CM | POA: Diagnosis not present

## 2022-08-22 DIAGNOSIS — Z86018 Personal history of other benign neoplasm: Secondary | ICD-10-CM | POA: Diagnosis not present

## 2022-08-22 DIAGNOSIS — Z85828 Personal history of other malignant neoplasm of skin: Secondary | ICD-10-CM | POA: Diagnosis not present

## 2022-08-22 DIAGNOSIS — D485 Neoplasm of uncertain behavior of skin: Secondary | ICD-10-CM | POA: Diagnosis not present

## 2022-08-22 DIAGNOSIS — D2222 Melanocytic nevi of left ear and external auricular canal: Secondary | ICD-10-CM | POA: Diagnosis not present

## 2022-11-14 DIAGNOSIS — Z961 Presence of intraocular lens: Secondary | ICD-10-CM | POA: Diagnosis not present

## 2022-11-14 DIAGNOSIS — H43813 Vitreous degeneration, bilateral: Secondary | ICD-10-CM | POA: Diagnosis not present

## 2022-11-14 DIAGNOSIS — H35373 Puckering of macula, bilateral: Secondary | ICD-10-CM | POA: Diagnosis not present

## 2022-12-20 DIAGNOSIS — E7800 Pure hypercholesterolemia, unspecified: Secondary | ICD-10-CM | POA: Diagnosis not present

## 2022-12-20 DIAGNOSIS — R7303 Prediabetes: Secondary | ICD-10-CM | POA: Diagnosis not present

## 2022-12-20 DIAGNOSIS — Z125 Encounter for screening for malignant neoplasm of prostate: Secondary | ICD-10-CM | POA: Diagnosis not present

## 2022-12-20 DIAGNOSIS — I10 Essential (primary) hypertension: Secondary | ICD-10-CM | POA: Diagnosis not present

## 2022-12-20 LAB — LAB REPORT - SCANNED
A1c: 6.3
EGFR (Non-African Amer.): 56

## 2022-12-26 DIAGNOSIS — Z0000 Encounter for general adult medical examination without abnormal findings: Secondary | ICD-10-CM | POA: Diagnosis not present

## 2022-12-26 DIAGNOSIS — R7303 Prediabetes: Secondary | ICD-10-CM | POA: Diagnosis not present

## 2022-12-26 DIAGNOSIS — N183 Chronic kidney disease, stage 3 unspecified: Secondary | ICD-10-CM | POA: Diagnosis not present

## 2022-12-26 DIAGNOSIS — I482 Chronic atrial fibrillation, unspecified: Secondary | ICD-10-CM | POA: Diagnosis not present

## 2022-12-26 DIAGNOSIS — R351 Nocturia: Secondary | ICD-10-CM | POA: Diagnosis not present

## 2022-12-26 DIAGNOSIS — Z7901 Long term (current) use of anticoagulants: Secondary | ICD-10-CM | POA: Diagnosis not present

## 2022-12-26 DIAGNOSIS — E7800 Pure hypercholesterolemia, unspecified: Secondary | ICD-10-CM | POA: Diagnosis not present

## 2022-12-26 DIAGNOSIS — I2510 Atherosclerotic heart disease of native coronary artery without angina pectoris: Secondary | ICD-10-CM | POA: Diagnosis not present

## 2022-12-26 DIAGNOSIS — N401 Enlarged prostate with lower urinary tract symptoms: Secondary | ICD-10-CM | POA: Diagnosis not present

## 2023-01-11 DIAGNOSIS — N401 Enlarged prostate with lower urinary tract symptoms: Secondary | ICD-10-CM | POA: Diagnosis not present

## 2023-01-11 DIAGNOSIS — R351 Nocturia: Secondary | ICD-10-CM | POA: Diagnosis not present

## 2023-01-26 DIAGNOSIS — R3915 Urgency of urination: Secondary | ICD-10-CM | POA: Diagnosis not present

## 2023-01-26 DIAGNOSIS — R8271 Bacteriuria: Secondary | ICD-10-CM | POA: Diagnosis not present

## 2023-01-26 DIAGNOSIS — R350 Frequency of micturition: Secondary | ICD-10-CM | POA: Diagnosis not present

## 2023-02-18 ENCOUNTER — Other Ambulatory Visit: Payer: Self-pay | Admitting: Physician Assistant

## 2023-03-08 DIAGNOSIS — D2222 Melanocytic nevi of left ear and external auricular canal: Secondary | ICD-10-CM | POA: Diagnosis not present

## 2023-03-08 DIAGNOSIS — Z86018 Personal history of other benign neoplasm: Secondary | ICD-10-CM | POA: Diagnosis not present

## 2023-03-08 DIAGNOSIS — D485 Neoplasm of uncertain behavior of skin: Secondary | ICD-10-CM | POA: Diagnosis not present

## 2023-03-08 DIAGNOSIS — L578 Other skin changes due to chronic exposure to nonionizing radiation: Secondary | ICD-10-CM | POA: Diagnosis not present

## 2023-03-08 DIAGNOSIS — C44212 Basal cell carcinoma of skin of right ear and external auricular canal: Secondary | ICD-10-CM | POA: Diagnosis not present

## 2023-03-08 DIAGNOSIS — L821 Other seborrheic keratosis: Secondary | ICD-10-CM | POA: Diagnosis not present

## 2023-03-08 DIAGNOSIS — L814 Other melanin hyperpigmentation: Secondary | ICD-10-CM | POA: Diagnosis not present

## 2023-03-08 DIAGNOSIS — D225 Melanocytic nevi of trunk: Secondary | ICD-10-CM | POA: Diagnosis not present

## 2023-03-08 DIAGNOSIS — Z85828 Personal history of other malignant neoplasm of skin: Secondary | ICD-10-CM | POA: Diagnosis not present

## 2023-04-04 DIAGNOSIS — C44212 Basal cell carcinoma of skin of right ear and external auricular canal: Secondary | ICD-10-CM | POA: Diagnosis not present

## 2023-06-26 DIAGNOSIS — R7303 Prediabetes: Secondary | ICD-10-CM | POA: Diagnosis not present

## 2023-06-26 LAB — LAB REPORT - SCANNED
A1c: 6
EGFR: 55

## 2023-07-03 DIAGNOSIS — E7800 Pure hypercholesterolemia, unspecified: Secondary | ICD-10-CM | POA: Diagnosis not present

## 2023-07-03 DIAGNOSIS — I10 Essential (primary) hypertension: Secondary | ICD-10-CM | POA: Diagnosis not present

## 2023-07-03 DIAGNOSIS — Z7901 Long term (current) use of anticoagulants: Secondary | ICD-10-CM | POA: Diagnosis not present

## 2023-07-03 DIAGNOSIS — R197 Diarrhea, unspecified: Secondary | ICD-10-CM | POA: Diagnosis not present

## 2023-07-03 DIAGNOSIS — I4821 Permanent atrial fibrillation: Secondary | ICD-10-CM | POA: Diagnosis not present

## 2023-07-06 ENCOUNTER — Other Ambulatory Visit: Payer: Self-pay | Admitting: Nurse Practitioner

## 2023-07-06 ENCOUNTER — Ambulatory Visit
Admission: RE | Admit: 2023-07-06 | Discharge: 2023-07-06 | Disposition: A | Discharge status: Home / Self Care | Source: Ambulatory Visit | Attending: Nurse Practitioner | Admitting: Nurse Practitioner

## 2023-07-06 DIAGNOSIS — R197 Diarrhea, unspecified: Secondary | ICD-10-CM | POA: Diagnosis not present

## 2023-07-06 DIAGNOSIS — K5900 Constipation, unspecified: Secondary | ICD-10-CM | POA: Diagnosis not present

## 2023-07-06 DIAGNOSIS — R140 Abdominal distension (gaseous): Secondary | ICD-10-CM | POA: Diagnosis not present

## 2023-07-07 DIAGNOSIS — R197 Diarrhea, unspecified: Secondary | ICD-10-CM | POA: Diagnosis not present

## 2023-08-02 DIAGNOSIS — Z9181 History of falling: Secondary | ICD-10-CM | POA: Diagnosis not present

## 2023-08-02 DIAGNOSIS — M25551 Pain in right hip: Secondary | ICD-10-CM | POA: Diagnosis not present

## 2023-08-02 DIAGNOSIS — R0781 Pleurodynia: Secondary | ICD-10-CM | POA: Diagnosis not present

## 2023-08-17 ENCOUNTER — Other Ambulatory Visit: Payer: Self-pay | Admitting: Internal Medicine

## 2023-08-24 ENCOUNTER — Ambulatory Visit: Payer: Medicare PPO | Attending: Internal Medicine | Admitting: Internal Medicine

## 2023-08-24 ENCOUNTER — Encounter: Payer: Self-pay | Admitting: Internal Medicine

## 2023-08-24 VITALS — BP 96/64 | HR 86 | Ht 75.0 in | Wt 256.2 lb

## 2023-08-24 DIAGNOSIS — I4821 Permanent atrial fibrillation: Secondary | ICD-10-CM

## 2023-08-24 DIAGNOSIS — E785 Hyperlipidemia, unspecified: Secondary | ICD-10-CM | POA: Diagnosis not present

## 2023-08-24 DIAGNOSIS — Z7901 Long term (current) use of anticoagulants: Secondary | ICD-10-CM

## 2023-08-24 DIAGNOSIS — I2510 Atherosclerotic heart disease of native coronary artery without angina pectoris: Secondary | ICD-10-CM

## 2023-08-24 NOTE — Progress Notes (Signed)
 OFFICE NOTE  Chief Complaint:  Follow-up  Primary Care Physician: Imelda Man, MD  HPI:  Gerald Frey is a 78 year old gentleman who I am following for history of chronic A-fib on Coumadin , hypertension, dyslipidemia. Overall, again doing fairly well without any new concerns today. He denies any shortness of breath, palpitations, presyncope, or syncopal symptoms. INR in the office today was 3.0 which is stable. This is a history of coronary disease and had a stent to the LAD in 2005.  He has no anginal complaints. Recently he has missed some of his medications and he can definitely tell he feels worse, probably because of RVR.  However, he has been chopping wood without difficulty.  I saw Gerald Frey back in the office today. He is again without complaints. He denies any chest pain or worsening shortness of breath. He remains in chronic atrial fibrillation with an INR of 2.8 in the office. He's been very stable we discussed possibly changing to a direct oral anticoagulant before, but he prefers to stay on warfarin.  04/01/2016  Gerald Frey returns today for follow-up. He reports feeling well, although has gained weight over the last year. He denies any chest pain or worsening shortness of breath. His INR checked today was 2.9. It's been very stable. We again discussed possibly switching to a novel oral anticoagulant, however he prefers to stay on warfarin. Since he's been very well controlled, it's difficult to argue that there would be additional benefit from a NOAC. He's not had any significant bleeding complications. His last LV assessment was in 2009 when EF was 45-50% at that time. He is overdue for cholesterol testing as well.  04/03/2017  Gerald Frey was seen today in follow-up.  Is been a year since we last saw him.  He has been working on weight loss and is 1 pound lighter than we saw him last year which was lower than his previous weight.  He denies any chest  pain or worsening shortness of breath.  Blood pressure is well controlled today.  He is in long-standing persistent if not permanent atrial fibrillation.  He wishes to remain on warfarin and has had very stable INRs.  Ventricular rate is well controlled at 79 today.  LVEF normalized on his last echo in January 2018.  01/02/2018  Gerald Frey returns today for follow-up.  Recently he was seen in the A. fib clinic by Tera Fellows.  He was having symptoms concerning for chest pain.  Subsequently underwent an echocardiogram and stress test.  The stress test was negative for ischemia however showed mildly reduced LV function, but this was likely gating artifact as his echo showed normal systolic function.  He did report that he had some dizziness and felt somewhat dehydrated after working out in the yard.  He wondered if this might of provoked his A. fib.  This did improve with cooling down and drinking some Powerade.  He says since that episode a few months ago he is done very well and has had no further episodes.  He does get occasionally dizzy when he bends over to pick things up and stands up too quickly.  Of note he is on tamsulosin as well.  Most recent lipid profile showed LDL near goal at 71.  03/10/2021  Gerald Frey is seen today for follow-up.  I last saw him in 2019 however he has been followed up recently by Ervin Heath, PA-C.  Overall he seems to be doing fairly well.  He is also followed closely in our Coumadin  clinic.  He has been on warfarin for some time for atrial fibrillation.  Blood pressure is well controlled today 116/76.  EKG shows A. fib which is likely permanent and rate controlled at 82.  Recently reports has been having some stomach issues.  He feels it might be worsened by aspirin and is inquiring about stopping that.  We also discussed the possibility of him switching off of warfarin to a NOAC, but he was still hesitant.  06/10/2021  Gerald Frey returns today for follow-up.   Recently he injured himself carrying a piece of plywood.  He had apparently developed a hematoma and was seen by orthopedic surgery they did x-rays which showed no fracture.  He then came to our office for evaluation.  Apparently was seen by our nurse supervisor, who evaluated him and recommended Coumadin  clinic follow-up.  Please see this note in the chart.  Today he returns as he wishes to have his bruise evaluated.  I did examine it and shows that it is nearly resolved in the left thigh area.  Otherwise he continues on warfarin due primarily to cost issues.  Blood pressure is well controlled.  No chest pain or shortness of breath.  No awareness of A-fib which is rate controlled and permanent.  08/24/2023  Gerald Frey is seen today in follow-up.  He seems to be doing well.  He was doing some weed eating this morning.  Blood pressure today was 96/64.  He is asymptomatic with that.  He says at his physical last week the blood pressure was 114/70.  His cholesterols been well-controlled with LDL 73 in March, total 137, HDL 43 and triglycerides 118.  Hemoglobin A1c of 6%.  He has been on rosuvastatin  20 mg daily.  He said he stopped it for the last couple weeks as an experiment to see if his leg pain is improved.  He might note some improvement but plans to stop it for another 2 more weeks to see if he gets any better.  PMHx:  Past Medical History:  Diagnosis Date   Atrial fibrillation (HCC) 06/19/2007   2D Echo EF=45-50%   CAD (coronary artery disease) 2005   Stents.   Dyslipidemia    Hypertension    Palpitation     Past Surgical History:  Procedure Laterality Date   APPENDECTOMY  01/2000   CARDIAC CATHETERIZATION  03/18/2004   CYPHER 3.5x22mm stent in the proximal LAD   COLONOSCOPY  07/2004   hx colon polyps   KNEE CARTILAGE SURGERY  11/2002    FAMHx:  Family History  Problem Relation Age of Onset   Emphysema Mother 41   Heart failure Father    Alzheimer's disease Father 36     SOCHx:   reports that he has never smoked. He has never used smokeless tobacco. He reports that he does not drink alcohol and does not use drugs.  ALLERGIES:  No Known Allergies  ROS: Pertinent items noted in HPI and remainder of comprehensive ROS otherwise negative.  HOME MEDS: Current Outpatient Medications  Medication Sig Dispense Refill   diltiazem  (CARDIZEM  CD) 120 MG 24 hr capsule TAKE 1 CAPSULE BY MOUTH EVERY DAY. NEEDS APPOINTMENT FOR FUTURE REFILLS. 90 capsule 0   diphenhydrAMINE HCl (ALLERGY MED PO) Allergy Med     fish oil-omega-3 fatty acids 1000 MG capsule Take 1 g by mouth 2 (two) times daily.      metoprolol  tartrate (LOPRESSOR ) 50 MG tablet Take  1 tablet (50 mg total) by mouth 2 (two) times daily. 180 tablet 3   rivaroxaban  (XARELTO ) 20 MG TABS tablet Take 1 tablet (20 mg total) by mouth daily with supper. 90 tablet 3   rosuvastatin  (CRESTOR ) 20 MG tablet Take 1 tablet (20 mg total) by mouth daily. 90 tablet 3   tamsulosin (FLOMAX) 0.4 MG CAPS capsule Take 0.4 mg by mouth daily after supper.     cetirizine (ZYRTEC) 10 MG tablet Take 10 mg by mouth as needed.  (Patient not taking: Reported on 08/24/2023)     omeprazole (PRILOSEC) 20 MG capsule Take by mouth. (Patient not taking: Reported on 08/24/2023)     No current facility-administered medications for this visit.    LABS/IMAGING: No results found for this or any previous visit (from the past 48 hours). No results found.  VITALS: BP 96/64 (BP Location: Right Arm, Patient Position: Sitting, Cuff Size: Large)   Pulse 86   Ht 6\' 3"  (1.905 m)   Wt 256 lb 3.2 oz (116.2 kg)   SpO2 96%   BMI 32.02 kg/m   EXAM: General appearance: alert and no distress Neck: no adenopathy, no carotid bruit, no JVD, supple, symmetrical, trachea midline, and thyroid  not enlarged, symmetric, no tenderness/mass/nodules Lungs: clear to auscultation bilaterally Heart: irregularly irregular rhythm Abdomen: soft, non-tender; bowel sounds  normal; no masses,  no organomegaly Extremities: extremities normal, atraumatic, no cyanosis or edema Pulses: 2+ and symmetric Skin: Skin color, texture, turgor normal. No rashes or lesions Neurologic: Grossly normal  EKG: EKG Interpretation Date/Time:  Thursday Aug 24 2023 14:05:34 EDT Ventricular Rate:  79 PR Interval:    QRS Duration:  90 QT Interval:  342 QTC Calculation: 392 R Axis:   25  Text Interpretation: Atrial fibrillation Nonspecific ST and T wave abnormality When compared with ECG of 10-Nov-2017 09:38, Nonspecific T wave abnormality, worse in Inferior leads Nonspecific T wave abnormality, improved in Lateral leads Confirmed by Dinah Franco [phone]) on 08/24/2023 2:15:24 PM    ASSESSMENT: Coronary artery disease status post PCI of the LAD 2005 (low risk myoview  10/2017) Permanent atrial fibrillation on warfarin - CHADSVASC 2 (HTN, CHF) on warfarin History of ischemic cardiomyopathy, EF 45-50% (2009), up to 55-60% (2019) Hypertension Dyslipidemia  PLAN: 1.   Gerald Frey seems to be doing well.  He is in A-fib which is permanent and denies any chest pain or shortness of breath.  Blood pressure is controlled in fact today is hypotensive.  I advised him to drink some water and monitor blood pressure over the next couple days.  No medicine changes at this time.  Lipids are also pretty well-controlled.  He denies any anginal symptoms.   Plan otherwise follow-up annually or sooner as necessary.  Hazle Lites, MD, [facility], FNLA, FACP    [facility]  Medical Director of the [facility] Lipid [facility]  Cardiovascular Risk Reduction Clinic Diplomate of the American Board of Clinical Lipidology Attending Cardiologist  Direct Dial: [phone]  Fax: [phone]  Website:  [email or web address]   Aviva Lemmings Katelyne Galster 08/24/2023, 2:15 PM

## 2023-08-24 NOTE — Patient Instructions (Signed)
 Medication Instructions:  Check BP at home -- follow up in a few days via MyChart Stay well hydrated  NO med changes  *If you need a refill on your cardiac medications before your next appointment, please call your pharmacy*   Follow-Up: At [facility], you and your health needs are our priority.  As part of our continuing mission to provide you with exceptional heart care, our providers are all part of one team.  This team includes your primary Cardiologist (physician) and Advanced Practice Providers or APPs (Physician Assistants and Nurse Practitioners) who all work together to provide you with the care you need, when you need it.  Your next appointment:    12 months with Dr. Maximo Spar  We recommend signing up for the patient portal called "MyChart".  Sign up information is provided on this After Visit Summary.  MyChart is used to connect with patients for Virtual Visits (Telemedicine).  Patients are able to view lab/test results, encounter notes, upcoming appointments, etc.  Non-urgent messages can be sent to your provider as well.   To learn more about what you can do with MyChart, go to [email or web address].

## 2023-09-05 DIAGNOSIS — D225 Melanocytic nevi of trunk: Secondary | ICD-10-CM | POA: Diagnosis not present

## 2023-09-05 DIAGNOSIS — L219 Seborrheic dermatitis, unspecified: Secondary | ICD-10-CM | POA: Diagnosis not present

## 2023-09-05 DIAGNOSIS — D2222 Melanocytic nevi of left ear and external auricular canal: Secondary | ICD-10-CM | POA: Diagnosis not present

## 2023-09-05 DIAGNOSIS — Z86018 Personal history of other benign neoplasm: Secondary | ICD-10-CM | POA: Diagnosis not present

## 2023-09-05 DIAGNOSIS — L814 Other melanin hyperpigmentation: Secondary | ICD-10-CM | POA: Diagnosis not present

## 2023-09-05 DIAGNOSIS — Z85828 Personal history of other malignant neoplasm of skin: Secondary | ICD-10-CM | POA: Diagnosis not present

## 2023-09-05 DIAGNOSIS — L578 Other skin changes due to chronic exposure to nonionizing radiation: Secondary | ICD-10-CM | POA: Diagnosis not present

## 2023-09-05 DIAGNOSIS — L821 Other seborrheic keratosis: Secondary | ICD-10-CM | POA: Diagnosis not present

## 2023-10-05 ENCOUNTER — Other Ambulatory Visit: Payer: Self-pay | Admitting: Physician Assistant

## 2023-10-05 DIAGNOSIS — Z7901 Long term (current) use of anticoagulants: Secondary | ICD-10-CM

## 2023-10-05 DIAGNOSIS — I4821 Permanent atrial fibrillation: Secondary | ICD-10-CM

## 2023-10-09 NOTE — Telephone Encounter (Signed)
 Prescription refill request for Xarelto  received.  Indication:afib Last office visit:5/25 Weight:116.2  kg Age:78 Scr:1.33  3/25 CrCl:75.23  ml/min  Prescription refilled

## 2023-11-11 ENCOUNTER — Other Ambulatory Visit: Payer: Self-pay | Admitting: Internal Medicine

## 2023-11-20 DIAGNOSIS — H43813 Vitreous degeneration, bilateral: Secondary | ICD-10-CM | POA: Diagnosis not present

## 2023-11-20 DIAGNOSIS — H35373 Puckering of macula, bilateral: Secondary | ICD-10-CM | POA: Diagnosis not present

## 2023-11-20 DIAGNOSIS — Z961 Presence of intraocular lens: Secondary | ICD-10-CM | POA: Diagnosis not present

## 2023-12-21 DIAGNOSIS — E291 Testicular hypofunction: Secondary | ICD-10-CM | POA: Diagnosis not present

## 2023-12-21 DIAGNOSIS — R7303 Prediabetes: Secondary | ICD-10-CM | POA: Diagnosis not present

## 2023-12-21 DIAGNOSIS — Z0000 Encounter for general adult medical examination without abnormal findings: Secondary | ICD-10-CM | POA: Diagnosis not present

## 2023-12-21 DIAGNOSIS — N401 Enlarged prostate with lower urinary tract symptoms: Secondary | ICD-10-CM | POA: Diagnosis not present

## 2023-12-21 DIAGNOSIS — I10 Essential (primary) hypertension: Secondary | ICD-10-CM | POA: Diagnosis not present

## 2023-12-21 LAB — LAB REPORT - SCANNED
A1c: 5.9
EGFR: 58

## 2023-12-28 DIAGNOSIS — N1831 Chronic kidney disease, stage 3a: Secondary | ICD-10-CM | POA: Diagnosis not present

## 2023-12-28 DIAGNOSIS — R7303 Prediabetes: Secondary | ICD-10-CM | POA: Diagnosis not present

## 2023-12-28 DIAGNOSIS — I2510 Atherosclerotic heart disease of native coronary artery without angina pectoris: Secondary | ICD-10-CM | POA: Diagnosis not present

## 2023-12-28 DIAGNOSIS — E7800 Pure hypercholesterolemia, unspecified: Secondary | ICD-10-CM | POA: Diagnosis not present

## 2023-12-28 DIAGNOSIS — Z0000 Encounter for general adult medical examination without abnormal findings: Secondary | ICD-10-CM | POA: Diagnosis not present

## 2023-12-28 DIAGNOSIS — Z7901 Long term (current) use of anticoagulants: Secondary | ICD-10-CM | POA: Diagnosis not present

## 2023-12-28 DIAGNOSIS — I482 Chronic atrial fibrillation, unspecified: Secondary | ICD-10-CM | POA: Diagnosis not present

## 2023-12-30 ENCOUNTER — Other Ambulatory Visit: Payer: Self-pay | Admitting: Physician Assistant

## 2024-03-07 DIAGNOSIS — Z23 Encounter for immunization: Secondary | ICD-10-CM | POA: Diagnosis not present
